# Patient Record
Sex: Female | Born: 1965 | Race: White | Hispanic: No | Marital: Married | Smoking: Never smoker
Health system: Southern US, Community
[De-identification: ages and names within clinical notes are randomized; demographics above are authoritative.]

## PROBLEM LIST (undated history)

## (undated) DIAGNOSIS — G43909 Migraine, unspecified, not intractable, without status migrainosus: Secondary | ICD-10-CM

## (undated) DIAGNOSIS — E119 Type 2 diabetes mellitus without complications: Secondary | ICD-10-CM

## (undated) DIAGNOSIS — I10 Essential (primary) hypertension: Secondary | ICD-10-CM

## (undated) DIAGNOSIS — R112 Nausea with vomiting, unspecified: Secondary | ICD-10-CM

## (undated) DIAGNOSIS — Z9885 Transplanted organ removal status: Secondary | ICD-10-CM

## (undated) DIAGNOSIS — G4700 Insomnia, unspecified: Secondary | ICD-10-CM

## (undated) DIAGNOSIS — Z9889 Other specified postprocedural states: Secondary | ICD-10-CM

## (undated) DIAGNOSIS — E785 Hyperlipidemia, unspecified: Secondary | ICD-10-CM

## (undated) HISTORY — DX: Migraine, unspecified, not intractable, without status migrainosus: G43.909

## (undated) HISTORY — DX: Type 2 diabetes mellitus without complications: E11.9

## (undated) HISTORY — DX: Insomnia, unspecified: G47.00

## (undated) HISTORY — PX: WISDOM TOOTH EXTRACTION: SHX21

## (undated) HISTORY — DX: Hyperlipidemia, unspecified: E78.5

---

## 1989-12-07 DIAGNOSIS — Z9885 Transplanted organ removal status: Secondary | ICD-10-CM

## 1989-12-07 HISTORY — PX: SINUS SURGERY WITH INSTATRAK: SHX5215

## 1989-12-07 HISTORY — DX: Transplanted organ removal status: Z98.85

## 1989-12-07 HISTORY — PX: NEPHRECTOMY: SHX65

## 1999-02-06 ENCOUNTER — Other Ambulatory Visit: Admission: RE | Admit: 1999-02-06 | Discharge: 1999-02-06 | Payer: Self-pay | Admitting: *Deleted

## 2000-03-30 ENCOUNTER — Other Ambulatory Visit: Admission: RE | Admit: 2000-03-30 | Discharge: 2000-03-30 | Payer: Self-pay | Admitting: *Deleted

## 2001-04-04 ENCOUNTER — Other Ambulatory Visit: Admission: RE | Admit: 2001-04-04 | Discharge: 2001-04-04 | Payer: Self-pay | Admitting: *Deleted

## 2002-04-24 ENCOUNTER — Other Ambulatory Visit: Admission: RE | Admit: 2002-04-24 | Discharge: 2002-04-24 | Payer: Self-pay | Admitting: *Deleted

## 2002-05-26 ENCOUNTER — Encounter: Admission: RE | Admit: 2002-05-26 | Discharge: 2002-05-26 | Payer: Self-pay | Admitting: Internal Medicine

## 2002-05-26 ENCOUNTER — Encounter: Payer: Self-pay | Admitting: Internal Medicine

## 2003-05-30 ENCOUNTER — Other Ambulatory Visit: Admission: RE | Admit: 2003-05-30 | Discharge: 2003-05-30 | Payer: Self-pay | Admitting: *Deleted

## 2004-08-28 ENCOUNTER — Other Ambulatory Visit: Admission: RE | Admit: 2004-08-28 | Discharge: 2004-08-28 | Payer: Self-pay | Admitting: Obstetrics and Gynecology

## 2005-10-22 ENCOUNTER — Encounter: Admission: RE | Admit: 2005-10-22 | Discharge: 2005-10-22 | Payer: Self-pay | Admitting: Obstetrics and Gynecology

## 2006-03-05 ENCOUNTER — Other Ambulatory Visit: Admission: RE | Admit: 2006-03-05 | Discharge: 2006-03-05 | Payer: Self-pay | Admitting: Obstetrics and Gynecology

## 2006-12-01 ENCOUNTER — Ambulatory Visit: Payer: Self-pay | Admitting: Internal Medicine

## 2006-12-01 LAB — CONVERTED CEMR LAB
ALT: 34 units/L (ref 0–40)
AST: 29 units/L (ref 0–37)
Albumin: 3.9 g/dL (ref 3.5–5.2)
Alkaline Phosphatase: 39 units/L (ref 39–117)
BUN: 12 mg/dL (ref 6–23)
Basophils Absolute: 0 10*3/uL (ref 0.0–0.1)
Basophils Relative: 0.6 % (ref 0.0–1.0)
Bilirubin Urine: NEGATIVE
CO2: 30 meq/L (ref 19–32)
Calcium: 9.9 mg/dL (ref 8.4–10.5)
Chloride: 108 meq/L (ref 96–112)
Chol/HDL Ratio, serum: 4.3
Cholesterol: 251 mg/dL (ref 0–200)
Creatinine, Ser: 0.8 mg/dL (ref 0.4–1.2)
Eosinophil percent: 2.2 % (ref 0.0–5.0)
GFR calc non Af Amer: 85 mL/min
Glomerular Filtration Rate, Af Am: 103 mL/min/{1.73_m2}
Glucose, Bld: 95 mg/dL (ref 70–99)
HCT: 37.2 % (ref 36.0–46.0)
HDL: 57.9 mg/dL (ref >39.0)
Hemoglobin, Urine: NEGATIVE
Hemoglobin: 13.1 g/dL (ref 12.0–15.0)
Ketones, ur: NEGATIVE mg/dL
LDL DIRECT: 197.2 mg/dL
Leukocytes, UA: NEGATIVE
Lymphocytes Relative: 36.1 % (ref 12.0–46.0)
MCHC: 35.2 g/dL (ref 30.0–36.0)
MCV: 92.8 fL (ref 78.0–100.0)
Monocytes Absolute: 0.5 10*3/uL (ref 0.2–0.7)
Monocytes Relative: 8.8 % (ref 3.0–11.0)
Neutro Abs: 3.2 10*3/uL (ref 1.4–7.7)
Neutrophils Relative %: 52.3 % (ref 43.0–77.0)
Nitrite: NEGATIVE
Platelets: 337 10*3/uL (ref 150–400)
Potassium: 4.4 meq/L (ref 3.5–5.1)
RBC: 4.01 M/uL (ref 3.87–5.11)
RDW: 11.7 % (ref 11.5–14.6)
Sodium: 143 meq/L (ref 135–145)
Specific Gravity, Urine: 1.025 (ref 1.000–1.03)
TSH: 0.95 microintl units/mL (ref 0.35–5.50)
Total Bilirubin: 0.6 mg/dL (ref 0.3–1.2)
Total Protein, Urine: NEGATIVE mg/dL
Total Protein: 7.1 g/dL (ref 6.0–8.3)
Triglyceride fasting, serum: 73 mg/dL (ref 0–149)
Urine Glucose: NEGATIVE mg/dL
Urobilinogen, UA: 0.2 (ref 0.0–1.0)
VLDL: 15 mg/dL (ref 0–40)
WBC: 5.9 10*3/uL (ref 4.5–10.5)
pH: 6 (ref 5.0–8.0)

## 2006-12-08 ENCOUNTER — Ambulatory Visit: Payer: Self-pay | Admitting: Internal Medicine

## 2007-01-07 ENCOUNTER — Ambulatory Visit: Payer: Self-pay | Admitting: Internal Medicine

## 2007-12-15 ENCOUNTER — Ambulatory Visit: Payer: Self-pay | Admitting: Internal Medicine

## 2007-12-19 LAB — CONVERTED CEMR LAB
ALT: 22 units/L (ref 0–35)
AST: 21 units/L (ref 0–37)
Albumin: 4.2 g/dL (ref 3.5–5.2)
Alkaline Phosphatase: 53 units/L (ref 39–117)
BUN: 9 mg/dL (ref 6–23)
Basophils Absolute: 0 10*3/uL (ref 0.0–0.1)
Basophils Relative: 0.6 % (ref 0.0–1.0)
Bilirubin Urine: NEGATIVE
Bilirubin, Direct: 0.1 mg/dL (ref 0.0–0.3)
CO2: 26 meq/L (ref 19–32)
Calcium: 9.6 mg/dL (ref 8.4–10.5)
Chloride: 100 meq/L (ref 96–112)
Cholesterol: 249 mg/dL (ref 0–200)
Creatinine, Ser: 0.9 mg/dL (ref 0.4–1.2)
Direct LDL: 178.4 mg/dL
Eosinophils Absolute: 0.1 10*3/uL (ref 0.0–0.6)
Eosinophils Relative: 1.5 % (ref 0.0–5.0)
GFR calc Af Amer: 90 mL/min
GFR calc non Af Amer: 74 mL/min
Glucose, Bld: 102 mg/dL — ABNORMAL HIGH (ref 70–99)
HCT: 36.1 % (ref 36.0–46.0)
HDL: 48.8 mg/dL (ref >39.0)
Hemoglobin, Urine: NEGATIVE
Hemoglobin: 12.8 g/dL (ref 12.0–15.0)
Leukocytes, UA: NEGATIVE
Lymphocytes Relative: 30.8 % (ref 12.0–46.0)
MCHC: 35.5 g/dL (ref 30.0–36.0)
MCV: 92 fL (ref 78.0–100.0)
Monocytes Absolute: 0.5 10*3/uL (ref 0.2–0.7)
Monocytes Relative: 8.2 % (ref 3.0–11.0)
Neutro Abs: 3.6 10*3/uL (ref 1.4–7.7)
Neutrophils Relative %: 58.9 % (ref 43.0–77.0)
Nitrite: NEGATIVE
Platelets: 315 10*3/uL (ref 150–400)
Potassium: 3.7 meq/L (ref 3.5–5.1)
RBC: 3.92 M/uL (ref 3.87–5.11)
RDW: 11.9 % (ref 11.5–14.6)
Sodium: 135 meq/L (ref 135–145)
Specific Gravity, Urine: >=1.03 (ref 1.000–1.03)
TSH: 1.44 microintl units/mL (ref 0.35–5.50)
Total Bilirubin: 1 mg/dL (ref 0.3–1.2)
Total CHOL/HDL Ratio: 5.1
Total Protein, Urine: NEGATIVE mg/dL
Total Protein: 7.3 g/dL (ref 6.0–8.3)
Triglycerides: 85 mg/dL (ref 0–149)
Urine Glucose: NEGATIVE mg/dL
Urobilinogen, UA: 0.2 (ref 0.0–1.0)
VLDL: 17 mg/dL (ref 0–40)
WBC: 6 10*3/uL (ref 4.5–10.5)
pH: 6 (ref 5.0–8.0)

## 2007-12-28 ENCOUNTER — Ambulatory Visit: Payer: Self-pay | Admitting: Internal Medicine

## 2007-12-28 DIAGNOSIS — E785 Hyperlipidemia, unspecified: Secondary | ICD-10-CM | POA: Insufficient documentation

## 2007-12-28 DIAGNOSIS — K219 Gastro-esophageal reflux disease without esophagitis: Secondary | ICD-10-CM | POA: Insufficient documentation

## 2007-12-28 DIAGNOSIS — I10 Essential (primary) hypertension: Secondary | ICD-10-CM | POA: Insufficient documentation

## 2007-12-28 DIAGNOSIS — J019 Acute sinusitis, unspecified: Secondary | ICD-10-CM

## 2007-12-28 DIAGNOSIS — R519 Headache, unspecified: Secondary | ICD-10-CM | POA: Insufficient documentation

## 2007-12-28 DIAGNOSIS — R51 Headache: Secondary | ICD-10-CM

## 2008-02-01 ENCOUNTER — Encounter: Payer: Self-pay | Admitting: Internal Medicine

## 2008-02-02 ENCOUNTER — Encounter: Payer: Self-pay | Admitting: Internal Medicine

## 2008-02-09 ENCOUNTER — Telehealth: Payer: Self-pay | Admitting: Internal Medicine

## 2008-05-11 ENCOUNTER — Telehealth: Payer: Self-pay | Admitting: Internal Medicine

## 2008-05-22 ENCOUNTER — Telehealth: Payer: Self-pay | Admitting: Internal Medicine

## 2008-06-05 ENCOUNTER — Encounter: Admission: RE | Admit: 2008-06-05 | Discharge: 2008-06-05 | Payer: Self-pay | Admitting: Obstetrics and Gynecology

## 2008-06-25 ENCOUNTER — Ambulatory Visit: Payer: Self-pay | Admitting: Internal Medicine

## 2008-06-25 DIAGNOSIS — G4700 Insomnia, unspecified: Secondary | ICD-10-CM | POA: Insufficient documentation

## 2008-08-23 ENCOUNTER — Telehealth: Payer: Self-pay | Admitting: Internal Medicine

## 2008-12-21 ENCOUNTER — Ambulatory Visit: Payer: Self-pay | Admitting: Internal Medicine

## 2008-12-21 LAB — CONVERTED CEMR LAB
ALT: 20 units/L (ref 0–35)
AST: 23 units/L (ref 0–37)
Albumin: 4.2 g/dL (ref 3.5–5.2)
Alkaline Phosphatase: 49 units/L (ref 39–117)
BUN: 15 mg/dL (ref 6–23)
Basophils Absolute: 0.1 10*3/uL (ref 0.0–0.1)
Basophils Relative: 1.2 % (ref 0.0–3.0)
Bilirubin Urine: NEGATIVE
Bilirubin, Direct: 0.1 mg/dL (ref 0.0–0.3)
CO2: 28 meq/L (ref 19–32)
Calcium: 9.4 mg/dL (ref 8.4–10.5)
Chloride: 106 meq/L (ref 96–112)
Cholesterol: 227 mg/dL (ref 0–200)
Creatinine, Ser: 1 mg/dL (ref 0.4–1.2)
Direct LDL: 163.4 mg/dL
Eosinophils Absolute: 0.2 10*3/uL (ref 0.0–0.7)
Eosinophils Relative: 2.5 % (ref 0.0–5.0)
GFR calc Af Amer: 79 mL/min
GFR calc non Af Amer: 65 mL/min
Glucose, Bld: 109 mg/dL — ABNORMAL HIGH (ref 70–99)
HCT: 35.7 % — ABNORMAL LOW (ref 36.0–46.0)
HDL: 49.2 mg/dL (ref >39.0)
Hemoglobin, Urine: NEGATIVE
Hemoglobin: 12.7 g/dL (ref 12.0–15.0)
Ketones, ur: NEGATIVE mg/dL
Leukocytes, UA: NEGATIVE
Lymphocytes Relative: 27.7 % (ref 12.0–46.0)
MCHC: 35.5 g/dL (ref 30.0–36.0)
MCV: 94 fL (ref 78.0–100.0)
Monocytes Absolute: 0.4 10*3/uL (ref 0.1–1.0)
Monocytes Relative: 7.2 % (ref 3.0–12.0)
Neutro Abs: 3.8 10*3/uL (ref 1.4–7.7)
Neutrophils Relative %: 61.4 % (ref 43.0–77.0)
Nitrite: NEGATIVE
Platelets: 332 10*3/uL (ref 150–400)
Potassium: 4 meq/L (ref 3.5–5.1)
RBC: 3.8 M/uL — ABNORMAL LOW (ref 3.87–5.11)
RDW: 11.4 % — ABNORMAL LOW (ref 11.5–14.6)
Sodium: 140 meq/L (ref 135–145)
Specific Gravity, Urine: >=1.03 (ref 1.000–1.03)
TSH: 1.02 microintl units/mL (ref 0.35–5.50)
Total Bilirubin: 0.8 mg/dL (ref 0.3–1.2)
Total CHOL/HDL Ratio: 4.6
Total Protein, Urine: NEGATIVE mg/dL
Total Protein: 7.1 g/dL (ref 6.0–8.3)
Triglycerides: 68 mg/dL (ref 0–149)
Urine Glucose: NEGATIVE mg/dL
Urobilinogen, UA: 0.2 (ref 0.0–1.0)
VLDL: 14 mg/dL (ref 0–40)
WBC: 6.2 10*3/uL (ref 4.5–10.5)
pH: 5 (ref 5.0–8.0)

## 2008-12-28 ENCOUNTER — Ambulatory Visit: Payer: Self-pay | Admitting: Internal Medicine

## 2008-12-28 DIAGNOSIS — R0989 Other specified symptoms and signs involving the circulatory and respiratory systems: Secondary | ICD-10-CM | POA: Insufficient documentation

## 2008-12-28 DIAGNOSIS — R7309 Other abnormal glucose: Secondary | ICD-10-CM | POA: Insufficient documentation

## 2009-01-03 ENCOUNTER — Ambulatory Visit: Payer: Self-pay

## 2009-01-03 ENCOUNTER — Encounter: Payer: Self-pay | Admitting: Internal Medicine

## 2009-02-12 ENCOUNTER — Telehealth: Payer: Self-pay | Admitting: Internal Medicine

## 2009-07-08 ENCOUNTER — Encounter: Payer: Self-pay | Admitting: Internal Medicine

## 2009-08-20 ENCOUNTER — Telehealth: Payer: Self-pay | Admitting: Internal Medicine

## 2009-09-03 ENCOUNTER — Ambulatory Visit: Payer: Self-pay | Admitting: Internal Medicine

## 2009-09-23 ENCOUNTER — Telehealth: Payer: Self-pay | Admitting: Internal Medicine

## 2010-03-27 ENCOUNTER — Encounter: Payer: Self-pay | Admitting: Internal Medicine

## 2010-03-27 ENCOUNTER — Telehealth: Payer: Self-pay | Admitting: Internal Medicine

## 2010-10-03 ENCOUNTER — Ambulatory Visit: Payer: Self-pay | Admitting: Internal Medicine

## 2010-10-03 ENCOUNTER — Telehealth: Payer: Self-pay | Admitting: Internal Medicine

## 2010-10-03 LAB — CONVERTED CEMR LAB
ALT: 20 units/L (ref 0–35)
AST: 19 units/L (ref 0–37)
Albumin: 4 g/dL (ref 3.5–5.2)
Alkaline Phosphatase: 54 units/L (ref 39–117)
Basophils Absolute: 0 10*3/uL (ref 0.0–0.1)
Calcium: 9.6 mg/dL (ref 8.4–10.5)
Eosinophils Relative: 1.6 % (ref 0.0–5.0)
GFR calc non Af Amer: 81.11 mL/min (ref >60)
HCT: 35.3 % — ABNORMAL LOW (ref 36.0–46.0)
HDL: 43.4 mg/dL (ref >39.00)
Hemoglobin: 12.4 g/dL (ref 12.0–15.0)
Lymphocytes Relative: 28.6 % (ref 12.0–46.0)
Lymphs Abs: 1.6 10*3/uL (ref 0.7–4.0)
Monocytes Relative: 7.8 % (ref 3.0–12.0)
Neutro Abs: 3.5 10*3/uL (ref 1.4–7.7)
Nitrite: NEGATIVE
Potassium: 4.4 meq/L (ref 3.5–5.1)
RBC: 3.72 M/uL — ABNORMAL LOW (ref 3.87–5.11)
RDW: 13 % (ref 11.5–14.6)
Sodium: 138 meq/L (ref 135–145)
Specific Gravity, Urine: >=1.03 (ref 1.000–1.030)
TSH: 1.37 microintl units/mL (ref 0.35–5.50)
Total Protein, Urine: NEGATIVE mg/dL
Urobilinogen, UA: 0.2 (ref 0.0–1.0)
VLDL: 17.6 mg/dL (ref 0.0–40.0)
WBC: 5.7 10*3/uL (ref 4.5–10.5)

## 2010-10-07 ENCOUNTER — Ambulatory Visit: Payer: Self-pay | Admitting: Internal Medicine

## 2010-10-07 ENCOUNTER — Encounter: Payer: Self-pay | Admitting: Internal Medicine

## 2010-10-07 DIAGNOSIS — R209 Unspecified disturbances of skin sensation: Secondary | ICD-10-CM

## 2010-10-07 LAB — CONVERTED CEMR LAB: Vitamin B-12: 317 pg/mL (ref 211–911)

## 2010-10-14 ENCOUNTER — Telehealth: Payer: Self-pay | Admitting: Internal Medicine

## 2010-10-20 ENCOUNTER — Telehealth (INDEPENDENT_AMBULATORY_CARE_PROVIDER_SITE_OTHER): Payer: Self-pay | Admitting: *Deleted

## 2010-10-20 ENCOUNTER — Encounter: Payer: Self-pay | Admitting: Internal Medicine

## 2010-10-24 ENCOUNTER — Telehealth: Payer: Self-pay | Admitting: Internal Medicine

## 2011-01-08 NOTE — Assessment & Plan Note (Signed)
Summary: CPX /NWS  #   Vital Signs:  Patient profile:   45 year old female Height:      64 inches Weight:      159 pounds BMI:     27.39 Temp:     98.6 degrees F oral Pulse rate:   72 / minute Pulse rhythm:   regular Resp:     16 per minute BP sitting:   120 / 80  (left arm) Cuff size:   regular  Vitals Entered By: Lanier Prude, CMA(AAMA) (October 07, 2010 8:37 AM) CC: CPX Is Patient Diabetic? No   CC:  CPX.  History of Present Illness: The patient presents for a preventive health examination.  Preventive Screening-Counseling & Management  Caffeine-Diet-Exercise     Does Patient Exercise: yes  Current Medications (verified): 1)  Zolpidem Tartrate 10 Mg  Tabs (Zolpidem Tartrate) .Marland Kitchen.. 1 By Mouth At [facility] Prn 2)  Imitrex 50 Mg  Tabs (Sumatriptan Succinate) .Marland Kitchen.. 1 By Mouth Once Daily As Needed 3)  Macrobid 100 Mg Caps (Nitrofurantoin Monohyd Macro) .... Once Daily 4)  Vitamin D3 1000 Unit  Tabs (Cholecalciferol) .Marland Kitchen.. 1 Qd 5)  Diovan 320 Mg Tabs (Valsartan) .Marland Kitchen.. 1 By Mouth Daily 6)  Macrobid 100 Mg Caps (Nitrofurantoin Monohyd Macro) .Marland Kitchen.. 1 By Mouth Two Times A Day For A Urinary Infection  Allergies (verified): 1)  ! Pcn 2)  ! Fentanyl Citrate (Fentanyl Citrate) 3)  Codeine 4)  Sulfa  Past History:  Past Medical History: Last updated: 12/28/2008 Headache, migraines Hyperlipidemia Hypertension GERD      Dr Marina Goodell UTIs  gyn     Dr Adalberto Ill  Family History: Last updated: 12/28/2007 Family History High cholesterol  Past Surgical History: Denies surgical history  Social History: [employer], television/film set Married Never Smoked Regular exercise-yes Does Patient Exercise:  yes  Review of Systems  The patient denies anorexia, fever, weight loss, weight gain, vision loss, decreased hearing, hoarseness, chest pain, syncope, dyspnea on exertion, peripheral edema, prolonged cough, headaches, hemoptysis, abdominal pain, melena, hematochezia, severe indigestion/heartburn,  hematuria, incontinence, genital sores, muscle weakness, suspicious skin lesions, transient blindness, difficulty walking, depression, unusual weight change, abnormal bleeding, enlarged lymph nodes, angioedema, and breast masses.         forgetfull HAs  Physical Exam  General:  Well-developed,well-nourished,in no acute distress; alert,appropriate and cooperative throughout examination Head:  Normocephalic and atraumatic without obvious abnormalities. No apparent alopecia or balding. Eyes:  No corneal or conjunctival inflammation noted. EOMI. Perrla.  Ears:  External ear exam shows no significant lesions or deformities.  Otoscopic examination reveals clear canals, tympanic membranes are intact bilaterally without bulging, retraction, inflammation or discharge. Hearing is grossly normal bilaterally. Nose:  External nasal examination shows no deformity or inflammation. Nasal mucosa are pink and moist without lesions or exudates. Mouth:  Oral mucosa and oropharynx without lesions or exudates.  Teeth in good repair. Lungs:  Normal respiratory effort, chest expands symmetrically. Lungs are clear to auscultation, no crackles or wheezes. Heart:  Normal rate and regular rhythm. S1 and S2 normal without gallop, murmur, click, rub or other extra sounds. Abdomen:  Bowel sounds positive,abdomen soft and non-tender without masses, organomegaly or hernias noted. Msk:  No deformity or scoliosis noted of thoracic or lumbar spine.   Extremities:  No clubbing, cyanosis, edema, or deformity noted with normal full range of motion of all joints.   Neurologic:  No cranial nerve deficits noted. Station and gait are normal. Plantar reflexes are down-going bilaterally. DTRs are  symmetrical throughout. Sensory, motor and coordinative functions appear intact. Skin:  Intact without suspicious lesions or rashes Psych:  Cognition and judgment appear intact. Alert and cooperative with normal attention span and concentration. No  apparent delusions, illusions, hallucinations   Impression & Recommendations:  Problem # 1:  ROUTINE GENERAL MEDICAL EXAM@HEALTH  CARE FACL (ICD-V70.0) Assessment New Health and age related issues were discussed. Available screening tests and vaccinations were discussed as well. Healthy life style including good diet and exercise was discussed.  The labs were reviewed with the patient.  Orders: EKG w/ Interpretation (93000)  Problem # 2:  ABNORMAL GLUCOSE NEC (ICD-790.29) Assessment: Comment Only  Labs Reviewed: Creat: 0.8 (10/03/2010)     Problem # 3:  HYPERLIPIDEMIA (ICD-272.4) Assessment: Comment Only On diet  Problem # 4:  HEADACHE (ICD-784.0) poss work related - heavy helmet, confined space, noise etc Assessment: Deteriorated  Her updated medication list for this problem includes:    Imitrex 50 Mg Tabs (Sumatriptan succinate) .Marland Kitchen... 1 by mouth once daily as needed  Problem # 5:  Forgetfullness - mild Assessment: New She has been working a lot - up to 60 hrs per wk - asked to rest more  Complete Medication List: 1)  Imitrex 50 Mg Tabs (Sumatriptan succinate) .Marland Kitchen.. 1 by mouth once daily as needed 2)  Macrobid 100 Mg Caps (Nitrofurantoin monohyd macro) .... Once daily 3)  Vitamin D3 1000 Unit Tabs (Cholecalciferol) .Marland Kitchen.. 1 qd 4)  Diovan 320 Mg Tabs (Valsartan) .Marland Kitchen.. 1 by mouth daily 5)  Sonata 10 Mg Caps (Zaleplon) .Marland Kitchen.. 1 by mouth at bedtime prn  Other Orders: TLB-B12, Serum-Total ONLY (81191-Y78)  Patient Instructions: 1)  Please schedule a follow-up appointment in 1 year well w/labs. Prescriptions: SONATA 10 MG CAPS (ZALEPLON) 1 by mouth at bedtime prn  #30 x 6   Entered and Authorized by:   Tresa Garter MD   Signed by:   Tresa Garter MD on 10/07/2010   Method used:   Print then Give to Patient   RxID:   [id number] MACROBID 100 MG CAPS (NITROFURANTOIN MONOHYD MACRO) once daily  #90 x 3   Entered and Authorized by:   Tresa Garter MD    Signed by:   Tresa Garter MD on 10/07/2010   Method used:   Print then Give to Patient   RxID:   [id number] DIOVAN 320 MG TABS (VALSARTAN) 1 by mouth daily  #90 Tablet x 3   Entered and Authorized by:   Tresa Garter MD   Signed by:   Tresa Garter MD on 10/07/2010   Method used:   Print then Give to Patient   RxID:   [id number]    Orders Added: 1)  EKG w/ Interpretation [93000] 2)  TLB-B12, Serum-Total ONLY [82607-B12] 3)  Est. Patient 40-64 years [56387]   Immunization History:  Tetanus/Td Immunization History:    Tetanus/Td:  historical (07/18/2009)  Influenza Immunization History:    Influenza:  historical (09/25/2010)   Immunization History:  Tetanus/Td Immunization History:    Tetanus/Td:  Historical (07/18/2009)  Influenza Immunization History:    Influenza:  Historical (09/25/2010)  Preventive Care Screening  Mammogram:    Date:  01/07/2010    Results:  normal   Pap Smear:    Date:  01/07/2010    Results:  normal

## 2011-01-08 NOTE — Progress Notes (Signed)
Summary: AMBIEN  Phone Note Call from Patient Call back at [phone]   Summary of Call: Patient is requesting rx for ambien to be changed to be 1 once daily. Her insurance will only pay for #15 mthly w/the current directions.  Initial call taken by: Lamar Sprinkles, CMA,  March 27, 2010 10:55 AM  Follow-up for Phone Call        ok Follow-up by: Tresa Garter MD,  March 27, 2010 1:18 PM  Additional Follow-up for Phone Call Additional follow up Details #1::        Pt informed  Additional Follow-up by: Lamar Sprinkles, CMA,  March 27, 2010 1:48 PM    New/Updated Medications: ZOLPIDEM TARTRATE 10 MG  TABS (ZOLPIDEM TARTRATE) 1 by mouth at hs prn Prescriptions: ZOLPIDEM TARTRATE 10 MG  TABS (ZOLPIDEM TARTRATE) 1 by mouth at hs prn  #30 x 6   Entered by:   Lamar Sprinkles, CMA   Authorized by:   Tresa Garter MD   Signed by:   Lamar Sprinkles, CMA on 03/27/2010   Method used:   Telephoned to ...       [facility]  [address] [phone]* (retail)       [address]       [place], [place]  [address]       Ph: [phone] or [phone]       Fax: [phone]   RxID:   [id number]

## 2011-01-08 NOTE — Progress Notes (Signed)
Summary: PA-Lunesta  Phone Note From Pharmacy   Summary of Call: PA-Lunesta called [employer] @ [phone], awaiting form. Dagoberto Reef  October 20, 2010 8:27 AM  Faxed to [facility] @ [phone], awaiting approval. Dagoberto Reef  October 20, 2010 8:46 AM  Alfonso Patten approved 09/29/10-10/20/2011, pt aware. Initial call taken by: Dagoberto Reef,  October 20, 2010 10:15 AM

## 2011-01-08 NOTE — Progress Notes (Signed)
Summary: UTI  Phone Note Call from Patient   Summary of Call: Pt has UTI per MD, needs cipro. Pt informed but is req rx for macrobid. She feels that cipro never works for her and has had success with macrobid.  Initial call taken by: Lamar Sprinkles, CMA,  October 03, 2010 3:38 PM  Follow-up for Phone Call        ok Follow-up by: Tresa Garter MD,  October 03, 2010 4:51 PM    New/Updated Medications: MACROBID 100 MG CAPS (NITROFURANTOIN MONOHYD MACRO) 1 by mouth two times a day for a urinary infection Prescriptions: MACROBID 100 MG CAPS (NITROFURANTOIN MONOHYD MACRO) 1 by mouth two times a day for a urinary infection  #10 x 1   Entered and Authorized by:   Tresa Garter MD   Signed by:   Lamar Sprinkles, CMA on 10/03/2010   Method used:   Electronically to        [facility]  [address] [phone]* (retail)       [address]       [place], [place]  [address]       Ph: [phone] or [phone]       Fax: [phone]   RxID:   [id number]

## 2011-01-08 NOTE — Medication Information (Signed)
Summary: Prior autho & approved for Lunesta/[facility]  Prior autho & approved for Lunesta/[facility]   Imported By: Sherian Rein 10/22/2010 10:33:57  _____________________________________________________________________  External Attachment:    Type:   Image     Comment:   External Document

## 2011-01-08 NOTE — Progress Notes (Signed)
Summary: LUNESTA/Plot pt  Phone Note Call from Patient Call back at [phone]   Summary of Call: Pt wanted to stop ambien and was changed to sonata. She can not sleep on sonata. Pt would like to try lunesta that MD suggested at office visit.  Initial call taken by: Lamar Sprinkles, CMA,  October 14, 2010 9:18 AM  Follow-up for Phone Call        ok - sonota changed to lunesta and listed historically on EMR - may call in rx for pt, given #20 no refills to see if the med will work - f/u AVP if not effective Follow-up by: Newt Lukes MD,  October 14, 2010 9:59 AM  Additional Follow-up for Phone Call Additional follow up Details #1::        Pt informed  Additional Follow-up by: Lamar Sprinkles, CMA,  October 14, 2010 3:25 PM    New/Updated Medications: LUNESTA 3 MG TABS (ESZOPICLONE) 1 by mouth at bedtime as needed for sleep Prescriptions: LUNESTA 3 MG TABS (ESZOPICLONE) 1 by mouth at bedtime as needed for sleep  #20 x 0   Entered by:   Lamar Sprinkles, CMA   Authorized by:   Tresa Garter MD   Signed by:   Lamar Sprinkles, CMA on 10/14/2010   Method used:   Telephoned to ...       [facility]  [address] [phone]* (retail)       [address]       [place], [place]  [address]       Ph: [phone] or [phone]       Fax: [phone]   RxID:   [id number] LUNESTA 3 MG TABS (ESZOPICLONE) 1 by mouth at bedtime as needed for sleep  #20 x 0   Entered by:   Newt Lukes MD   Authorized by:   Tresa Garter MD   Signed by:   Newt Lukes MD on 10/14/2010   Method used:   Historical   RxID:   [id number]

## 2011-01-08 NOTE — Progress Notes (Signed)
Summary: Zolpidem PA  Phone Note From Pharmacy   Summary of Call: PA request--Zolpidem.  Available online, completed and approved until 2012. Initial call taken by: Lucious Groves,  March 27, 2010 4:59 PM

## 2011-01-08 NOTE — Progress Notes (Signed)
SummaryAlfonso Diaz  Phone Note Refill Request Call back at [facility] Phone [phone]   Refills Requested: Medication #1:  LUNESTA 3 MG TABS 1 by mouth at bedtime as needed for sleep. Patient is requesting rx for #30, PA was approved -[facility]  Initial call taken by: Lamar Sprinkles, CMA,  October 24, 2010 2:55 PM  Follow-up for Phone Call        ok 6 ref Follow-up by: Tresa Garter MD,  October 24, 2010 5:42 PM  Additional Follow-up for Phone Call Additional follow up Details #1::        Pt informed  Additional Follow-up by: Lamar Sprinkles, CMA,  October 24, 2010 6:11 PM    Prescriptions: LUNESTA 3 MG TABS (ESZOPICLONE) 1 by mouth at bedtime as needed for sleep  #90 x 1   Entered by:   Lamar Sprinkles, CMA   Authorized by:   Tresa Garter MD   Signed by:   Lamar Sprinkles, CMA on 10/24/2010   Method used:   Telephoned to ...       [facility]  [address] [phone]* (retail)       [address]       [place], [place]  [address]       Ph: [phone] or [phone]       Fax: [phone]   RxID:   [id number]

## 2011-01-09 NOTE — Medication Information (Signed)
Summary: Approved/[facility]  Approved/[employer]   Imported By: Lester [place] 04/04/2010 10:36:52  _____________________________________________________________________  External Attachment:    Type:   Image     Comment:   External Document

## 2011-04-24 NOTE — Assessment & Plan Note (Signed)
[facility]                         GASTROENTEROLOGY OFFICE NOTE   [name], [name]                     MRN:          [id number]  DATE:01/07/2007                            DOB:          1966-04-20    REFERRING PHYSICIAN:  Georgina Quint. Plotnikov, MD.   REASON FOR CONSULTATION:  Acid reflux, gas and a family history of colon  cancer.   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 Posey Rea on December 08, 2006.  She is  referred now regarding the above listed issues.  First, patient has had  some problems with heartburn intermittently over the past year.  For  this she takes Tums with good relief of symptoms.  No nausea, vomiting,  or dysphagia.  She has had some weight gain over the past year.  Next,  she reports some mild gas and bloating generally after meals.  No  associated pain.  Her bowel habits have been regular.  No melena or  hematochezia.  Her appetite is good.  She does have a family history of  colon cancer in her maternal grandmother.  Grandmother was diagnosed at  a very advanced age while she was residing in a nursing home.  The  patient's mother has been screened with no polyps found.  There were no  other family members with a history of colon cancer or colon polyps.  Patient, herself, denies having undergone prior GI evaluations.   PAST MEDICAL HISTORY:  1. Hypertension.  2. Hyperlipidemia.  3. History of migraines.   PAST SURGICAL HISTORY:  History of right nephrectomy.   ALLERGIES:  FENTANYL which results in a rash and shortness of breath.   CURRENT MEDICATION:  Methyldopa 250 mg b.i.d.   FAMILY HISTORY:  Maternal grandmother diagnosed with colon cancer at an  advanced age.  Mother and grandparents also with diabetes and heart  disease.   SOCIAL HISTORY:  Patient is married without children.  She has a  bachelor's degree.  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  She does not smoke or use alcohol.   REVIEW OF SYSTEMS:  Per diagnostic evaluation form.   PHYSICAL EXAMINATION:  GENERAL APPEARANCE:  A well-appearing female in  no acute distress.  VITAL SIGNS:  Blood pressure 124/84, heart rate 78, weight 146.2 pounds,  she is 5 feet 2 inches in height.  HEENT:  Sclerae are anicteric.  Conjunctivae are pink.  Oral mucosa is  intact, no adenopathy.  LUNGS:  Clear.  CARDIOVASCULAR:  Regular.  ABDOMEN:  Soft without tenderness, mass or hernia.  Good bowel sounds  heard.   LABORATORY DATA:  Recent laboratories reveal normal CBC and  comprehensive metabolic panel as well as thyroid testing and urinalysis.   IMPRESSION:  1. Gastroesophageal reflux disease, mild without alarm symptoms.  2. Mild gas and bloating, nonspecific.  3. Family history of colon cancer in an elderly second degree      relative.   RECOMMENDATIONS:  1. Continue to use antacids p.r.n.  If symptoms become more  prominent,      could consider once daily proton pump inhibitor therapy with      Prilosec OTC.  2. Reflux precautions with attention to weight loss.  3. Due for a routine screening colonoscopy at age 69 unless otherwise      clinically indicated.     Wilhemina Bonito. Marina Goodell, MD  Electronically Signed    JNP/MedQ  DD: 01/07/2007  DT: 01/07/2007  Job #: [id number]

## 2011-04-24 NOTE — Assessment & Plan Note (Signed)
[facility]                           PRIMARY CARE OFFICE NOTE   NAME:[name], [name]                     MRN:          [id number]  DATE:12/08/2006                            DOB:          May 11, 1966    The patient is a 45 year old female who presents to reestablish primary  and for wellness examination.   PAST MEDICAL HISTORY:  1. Elevated blood pressure.  2. Elevated cholesterol.   FAMILY HISTORY:  Her father has elevated cholesterol.   SOCIAL HISTORY:  She is currently employed in [employer],  working 40-50 hours per week.  The job is stressful.  The husband is  planning to become a Garment/textile technologist in [place].  She is planning a  pregnancy.   CURRENT MEDICATIONS:  1. Folic acid daily.  2. Methyldopa 250 mg 3 times a day.  3. She just went off the birth control pill.   ALLERGIES:  1. PENICILLIN.  2. CODEINE.  3. SULFA.  4. FENTANYL.   REVIEW OF SYSTEMS:  No chest pain or shortness of breath.  No syncope.  She was using Ambien for chronic insomnia.  Tylenol for occasional  headaches.  The rest of the 18-point review of systems is negative.   PHYSICAL EXAMINATION:  VITAL SIGNS:  Blood pressure 131/86, pulse 73,  temperature 97.2, weight 149 pounds.  GENERAL:  She is in no acute distress.  Looks well.  HEENT:  With moist mucosa.  NECK:  Supple. No thyromegaly or bruit.  LUNGS:  Clear.  No wheezes or rales.  HEART:  S1, S2.  No murmur, no gallop.  ABDOMEN:  Soft, nontender.  No organomegaly or masses felt.  LOWER EXTREMITIES:  Without edema.  MENTAL STATUS:  She is alert and cooperative.  Denies being depressed.  SKIN:  Clear.   LABORATORIES:  On December 01, 2006, CMET normal. CBC normal.  Cholesterol 251, HDL 57, LDL 197.  TSH normal.  Urinalysis normal.   ASSESSMENT AND PLAN:  1. Normal Wellness Examination.  Age/health-related issues discussed.      Healthy lifestyle discussed.  She is using folic acid anticipating      pregnancy.  Regular checkups with Dr. Vincente Poli.  2. Hypertension.  She is currently on methyldopa 250mg , 3 times a day.      Blood pressure is controlled.  3. Insomnia.  She is aware she will have to discontinue Ambien if      pregnant (category C).  She can try valerian root.  4. Migraine headaches.  If pregnant, she can use Tylenol p.r.n.  Other      pain medications are category C for pregnancy.  5. Dyslipidemia.  She can use fish oil.  Will recheck in a year.     Georgina Quint. Plotnikov, MD  Electronically Signed    AVP/MedQ  DD: 12/09/2006  DT: 12/09/2006  Job #: [id number]

## 2011-05-18 ENCOUNTER — Telehealth: Payer: Self-pay | Admitting: *Deleted

## 2011-05-18 MED ORDER — ESZOPICLONE 3 MG PO TABS: 3.0000 mg | ORAL_TABLET | Freq: Every evening | ORAL | Status: DC | PRN

## 2011-05-18 NOTE — Telephone Encounter (Signed)
rec Rf req for Lunesta 3 mg 1 po qhs. # 90. Last filled 04-08-11. Ok to Rf?

## 2011-05-18 NOTE — Telephone Encounter (Signed)
Pt called req this RF of lunesta - She is out of med, please advise.

## 2011-05-18 NOTE — Telephone Encounter (Signed)
Done, left vm for pt to check w/pharm  

## 2011-05-18 NOTE — Telephone Encounter (Signed)
OK to fill this prescription with additional refills x3 Thank you!  

## 2011-08-11 ENCOUNTER — Telehealth: Payer: Self-pay | Admitting: *Deleted

## 2011-08-11 NOTE — Telephone Encounter (Signed)
rf req for Lunesta 3 mg 1 po qhs. # 90. Last filled 07-15-11. Ok to Rf?

## 2011-08-11 NOTE — Telephone Encounter (Signed)
OK to fill this prescription with additional refills x1 Thank you!  

## 2011-08-12 MED ORDER — ESZOPICLONE 3 MG PO TABS: 3.0000 mg | ORAL_TABLET | Freq: Every evening | ORAL | Status: DC | PRN

## 2011-09-23 ENCOUNTER — Telehealth: Payer: Self-pay | Admitting: *Deleted

## 2011-09-23 NOTE — Telephone Encounter (Signed)
Pt left vm stating Lunesta 3 mg requires PA. Call [phone] Ref# [id number].

## 2011-09-24 NOTE — Telephone Encounter (Signed)
Coverage review for [name] is required because med is non formulary. Gave pertinent info to representative.   Med is approved from 09-03-11 until 09-23-2012.  Pt's husband and pharmacy informed.

## 2011-10-15 ENCOUNTER — Other Ambulatory Visit: Payer: Self-pay | Admitting: Internal Medicine

## 2011-10-26 ENCOUNTER — Encounter (HOSPITAL_COMMUNITY): Payer: Self-pay | Admitting: *Deleted

## 2011-10-26 ENCOUNTER — Inpatient Hospital Stay (HOSPITAL_COMMUNITY): Payer: BC Managed Care – PPO

## 2011-10-26 ENCOUNTER — Inpatient Hospital Stay (HOSPITAL_COMMUNITY)
Admission: AD | Admit: 2011-10-26 | Discharge: 2011-10-26 | Disposition: A | Discharge status: Home / Self Care | Payer: BC Managed Care – PPO | Source: Ambulatory Visit | Attending: Obstetrics and Gynecology | Admitting: Obstetrics and Gynecology

## 2011-10-26 DIAGNOSIS — J019 Acute sinusitis, unspecified: Secondary | ICD-10-CM

## 2011-10-26 DIAGNOSIS — E785 Hyperlipidemia, unspecified: Secondary | ICD-10-CM

## 2011-10-26 DIAGNOSIS — R7309 Other abnormal glucose: Secondary | ICD-10-CM

## 2011-10-26 DIAGNOSIS — R51 Headache: Secondary | ICD-10-CM

## 2011-10-26 DIAGNOSIS — R0989 Other specified symptoms and signs involving the circulatory and respiratory systems: Secondary | ICD-10-CM

## 2011-10-26 DIAGNOSIS — R209 Unspecified disturbances of skin sensation: Secondary | ICD-10-CM

## 2011-10-26 DIAGNOSIS — I10 Essential (primary) hypertension: Secondary | ICD-10-CM

## 2011-10-26 DIAGNOSIS — R1032 Left lower quadrant pain: Secondary | ICD-10-CM | POA: Insufficient documentation

## 2011-10-26 DIAGNOSIS — G4700 Insomnia, unspecified: Secondary | ICD-10-CM

## 2011-10-26 DIAGNOSIS — K219 Gastro-esophageal reflux disease without esophagitis: Secondary | ICD-10-CM

## 2011-10-26 HISTORY — DX: Nausea with vomiting, unspecified: R11.2

## 2011-10-26 HISTORY — DX: Essential (primary) hypertension: I10

## 2011-10-26 HISTORY — DX: Transplanted organ removal status: Z98.85

## 2011-10-26 HISTORY — DX: Other specified postprocedural states: Z98.890

## 2011-10-26 LAB — COMPREHENSIVE METABOLIC PANEL
Alkaline Phosphatase: 63 U/L (ref 39–117)
BUN: 12 mg/dL (ref 6–23)
CO2: 24 mEq/L (ref 19–32)
Calcium: 9.5 mg/dL (ref 8.4–10.5)
GFR calc Af Amer: >90 mL/min (ref >90)
GFR calc non Af Amer: >90 mL/min (ref >90)
Glucose, Bld: 98 mg/dL (ref 70–99)
Total Protein: 7.4 g/dL (ref 6.0–8.3)

## 2011-10-26 LAB — CBC
HCT: 35.6 % — ABNORMAL LOW (ref 36.0–46.0)
Hemoglobin: 11.9 g/dL — ABNORMAL LOW (ref 12.0–15.0)
MCH: 31.8 pg (ref 26.0–34.0)
MCHC: 33.4 g/dL (ref 30.0–36.0)
RBC: 3.74 MIL/uL — ABNORMAL LOW (ref 3.87–5.11)

## 2011-10-26 MED ORDER — SODIUM CHLORIDE 0.9 % IJ SOLN
INTRAMUSCULAR | Status: AC
  Filled 2011-10-26: qty 6

## 2011-10-26 MED ORDER — KETOROLAC TROMETHAMINE 30 MG/ML IJ SOLN
30.0000 mg | Freq: Once | INTRAMUSCULAR | Status: AC
  Administered 2011-10-26: 30 mg via INTRAVENOUS
  Filled 2011-10-26: qty 1

## 2011-10-26 MED ORDER — SODIUM CHLORIDE 0.9 % IJ SOLN
INTRAMUSCULAR | Status: AC
  Filled 2011-10-26: qty 3

## 2011-10-26 MED ORDER — IOHEXOL 300 MG/ML  SOLN
100.0000 mL | Freq: Once | INTRAMUSCULAR | Status: AC | PRN
  Administered 2011-10-26: 100 mL via INTRAVENOUS

## 2011-10-26 NOTE — ED Provider Notes (Addendum)
[name] is a 45 y.o. female who presents to MAU for blood work and CT scan of abdomen for LLQ abdominal pain. The patient was evaluated in the office prior to MAU visit. She has only one kidney so a CBC and CMP was ordered prior to the procedure. The labs are normal. I discussed with Dr. Vincente Poli. Will call her with results of CT. Medical screening exam complete and patient is stable to complete treatment and await further evaluation from Dr. Vincente Poli.  Kerrie Buffalo, NP 10/26/11 1816  Kerrie Buffalo, NP 10/26/11 1610

## 2011-10-26 NOTE — Discharge Instructions (Signed)
Call physician if any worsening symptoms and follow up with DR Vincente Poli

## 2011-10-26 NOTE — Progress Notes (Signed)
Patient states she started having abdominal pain about 4 days ago. Was seen in the office today and sent to MAU for further evaluation and a CT scan.

## 2011-10-26 NOTE — ED Notes (Signed)
Dondra Spry in radiology notified of pt. with 1 kidney. Mayer Camel, NP reviewed BUN and creatinine result. No need for further lab testing. Contrast started.

## 2011-11-05 ENCOUNTER — Encounter: Payer: Self-pay | Admitting: Internal Medicine

## 2011-11-05 ENCOUNTER — Ambulatory Visit (INDEPENDENT_AMBULATORY_CARE_PROVIDER_SITE_OTHER): Payer: BC Managed Care – PPO | Admitting: Internal Medicine

## 2011-11-05 DIAGNOSIS — I10 Essential (primary) hypertension: Secondary | ICD-10-CM

## 2011-11-05 DIAGNOSIS — Z905 Acquired absence of kidney: Secondary | ICD-10-CM | POA: Insufficient documentation

## 2011-11-05 MED ORDER — AMLODIPINE BESYLATE-VALSARTAN 5-320 MG PO TABS: 1.0000 | ORAL_TABLET | Freq: Every day | ORAL | Status: DC

## 2011-11-05 MED ORDER — FLUTICASONE PROPIONATE 50 MCG/ACT NA SUSP: 2.0000 | Freq: Every day | NASAL | Status: DC

## 2011-11-05 MED ORDER — AZITHROMYCIN 250 MG PO TABS: ORAL_TABLET | ORAL | Status: AC

## 2011-11-05 NOTE — Progress Notes (Signed)
  Subjective:    Patient ID: [name], female    DOB: [birth year], 45 y.o.   MRN: [id number]  HPI  C/o elev BP x 1 month She had a HA last night with a BP 170/90 - she took an extra Diovan She is bothered w/a fibroid She is sick w/a sinus x 1 mo - took a Zpac Review of Systems  Constitutional: Negative for chills, activity change, appetite change, fatigue and unexpected weight change.  HENT: Positive for congestion, rhinorrhea and postnasal drip. Negative for mouth sores and sinus pressure.   Eyes: Negative for visual disturbance.  Respiratory: Negative for cough and chest tightness.   Gastrointestinal: Negative for nausea and abdominal pain.  Genitourinary: Negative for frequency, difficulty urinating and vaginal pain.  Musculoskeletal: Negative for back pain and gait problem.  Skin: Negative for pallor and rash.  Neurological: Positive for headaches. Negative for dizziness, tremors, weakness and numbness.  Psychiatric/Behavioral: Negative for confusion and sleep disturbance.       Objective:   Physical Exam  Constitutional: She appears well-developed and well-nourished. No distress.  HENT:  Head: Normocephalic.  Right Ear: External ear normal.  Left Ear: External ear normal.       eryth nasal mucosa  Eyes: Conjunctivae are normal. Pupils are equal, round, and reactive to light. Right eye exhibits no discharge. Left eye exhibits no discharge.  Neck: Normal range of motion. Neck supple. No JVD present. No tracheal deviation present. No thyromegaly present.  Cardiovascular: Normal rate, regular rhythm and normal heart sounds.   Pulmonary/Chest: No stridor. No respiratory distress. She has no wheezes.  Abdominal: Soft. Bowel sounds are normal. She exhibits no distension and no mass. There is no tenderness. There is no rebound and no guarding.  Musculoskeletal: She exhibits no edema and no tenderness.  Lymphadenopathy:    She has no cervical adenopathy.  Neurological: She  displays normal reflexes. No cranial nerve deficit. She exhibits normal muscle tone. Coordination normal.  Skin: No rash noted. No erythema.  Psychiatric: She has a normal mood and affect. Her behavior is normal. Judgment and thought content normal.    Lab Results  Component Value Date   WBC 8.7 10/26/2011   HGB 11.9* 10/26/2011   HCT 35.6* 10/26/2011   PLT 336 10/26/2011   GLUCOSE 98 10/26/2011   CHOL 221* 10/03/2010   TRIG 88.0 10/03/2010   HDL 43.40 10/03/2010   LDLDIRECT 150.3 10/03/2010   ALT 16 10/26/2011   AST 16 10/26/2011   NA 135 10/26/2011   K 3.9 10/26/2011   CL 99 10/26/2011   CREATININE 0.72 10/26/2011   BUN 12 10/26/2011   CO2 24 10/26/2011   TSH 1.37 10/03/2010         Assessment & Plan:

## 2011-11-05 NOTE — Assessment & Plan Note (Signed)
Treat HTN Monitor creat

## 2011-11-05 NOTE — Assessment & Plan Note (Signed)
See meds Treat sinusitis

## 2011-11-27 ENCOUNTER — Telehealth: Payer: Self-pay | Admitting: *Deleted

## 2011-11-27 MED ORDER — AMLODIPINE BESYLATE-VALSARTAN 10-320 MG PO TABS: 1.0000 | ORAL_TABLET | Freq: Every day | ORAL | Status: DC

## 2011-11-27 NOTE — Telephone Encounter (Signed)
Take Exforge 320-10 1 a day - pick up samples and rx on Mon Thx

## 2011-11-27 NOTE — Telephone Encounter (Signed)
Can she take HCTZ? If yes, - pick up samples of Exforge hct 320-5-25 one a day and a rx Thx

## 2011-11-27 NOTE — Telephone Encounter (Signed)
No- she cant take HCTZ due to flying. Please advise

## 2011-11-27 NOTE — Telephone Encounter (Signed)
Pt states that her diastolic # is still elevated (around 90). Pt is asking MD to increase the dosage of Exforge to help with BP.

## 2011-12-02 NOTE — Telephone Encounter (Signed)
Pt informed already on 11-30-11.

## 2011-12-21 ENCOUNTER — Telehealth: Payer: Self-pay

## 2011-12-21 NOTE — Telephone Encounter (Signed)
Pt called stating that her BP is still uncontrolled with medication; 140/98. Pt would like to try alternative to Diovan, please advise.

## 2011-12-22 MED ORDER — CARVEDILOL 12.5 MG PO TABS: 12.5000 mg | ORAL_TABLET | Freq: Two times a day (BID) | ORAL | Status: DC

## 2011-12-22 NOTE — Telephone Encounter (Signed)
Pt informed

## 2011-12-22 NOTE — Telephone Encounter (Signed)
Pt calling again today re: below. Please advise.

## 2011-12-22 NOTE — Telephone Encounter (Signed)
Cont current Rx Add Coreg 12.5 mg bid Thx

## 2012-02-05 LAB — HM MAMMOGRAPHY

## 2012-02-08 ENCOUNTER — Telehealth: Payer: Self-pay | Admitting: *Deleted

## 2012-02-08 MED ORDER — ESZOPICLONE 3 MG PO TABS: 3.0000 mg | ORAL_TABLET | Freq: Every day | ORAL | Status: DC | PRN

## 2012-02-08 NOTE — Telephone Encounter (Signed)
Rf req for Lunesta 3 mg 1 po qhs. Ok to Rf in PCP absence?

## 2012-02-08 NOTE — Telephone Encounter (Signed)
Ok for refill  x3 

## 2012-06-27 ENCOUNTER — Other Ambulatory Visit: Payer: Self-pay | Admitting: Internal Medicine

## 2012-06-28 NOTE — Telephone Encounter (Signed)
Ok to Rf? 

## 2012-08-24 ENCOUNTER — Telehealth: Payer: Self-pay | Admitting: *Deleted

## 2012-08-24 NOTE — Telephone Encounter (Signed)
Rf req for Lunesta 3mg  1 po qhs. Last filled 07/29/12. Ok to Rf?

## 2012-08-24 NOTE — Telephone Encounter (Signed)
OK to fill this prescription with additional refills x2 Thank you!  

## 2012-08-25 MED ORDER — ESZOPICLONE 3 MG PO TABS: 3.0000 mg | ORAL_TABLET | Freq: Every day | ORAL | Status: DC | PRN

## 2012-08-25 NOTE — Telephone Encounter (Signed)
RX called in per MD. 

## 2012-09-30 ENCOUNTER — Telehealth: Payer: Self-pay | Admitting: *Deleted

## 2012-09-30 NOTE — Telephone Encounter (Signed)
Lunesta PA form faxed to ins. Will wait for ins decision.

## 2012-10-17 ENCOUNTER — Telehealth: Payer: Self-pay | Admitting: *Deleted

## 2012-10-17 NOTE — Telephone Encounter (Signed)
Lunesta PA approved from 09/09/2012-09/30/2013. Pt and pharmacy informed.

## 2012-10-17 NOTE — Telephone Encounter (Signed)
PA approved. Pt informed and pharmacy informed.

## 2012-10-26 ENCOUNTER — Other Ambulatory Visit: Payer: Self-pay | Admitting: Internal Medicine

## 2012-10-26 DIAGNOSIS — Z0000 Encounter for general adult medical examination without abnormal findings: Secondary | ICD-10-CM

## 2012-11-22 ENCOUNTER — Other Ambulatory Visit: Payer: Self-pay | Admitting: Internal Medicine

## 2012-11-25 ENCOUNTER — Other Ambulatory Visit: Payer: Self-pay | Admitting: Internal Medicine

## 2012-11-25 NOTE — Telephone Encounter (Signed)
Ok to Rf? 

## 2012-12-09 ENCOUNTER — Telehealth: Payer: Self-pay | Admitting: Internal Medicine

## 2012-12-09 NOTE — Telephone Encounter (Signed)
Caller: [name]/Patient; Phone: [phone]; Reason for Call: Patient calling about prior authorization for Lunesta.  States has active Rx which needs to be approved by insurance.  Patient is extremely frustrated that this has not been done since pharmacy requested it after 12/07/12.  States her [employer] coupon was not viable until 12/07/12.  Per Epic, no note in chart; patient states she did speak with after-hours RN 12/08/12 who sent a note to office.  RN can see this note in after-hours system.  Info to office for staff/provider attention in AM.  Krs/can

## 2012-12-12 ENCOUNTER — Telehealth: Payer: Self-pay | Admitting: Internal Medicine

## 2012-12-12 NOTE — Telephone Encounter (Signed)
Pt states she has left messages to let us know she needs a new PA on Eszopiclone or Lunesta for 2014 as of Jan 1 per her insurance [address].

## 2012-12-13 NOTE — Telephone Encounter (Signed)
PA form faxed on 12/12/12.

## 2012-12-13 NOTE — Telephone Encounter (Signed)
PA form was faxed on 12/13/11.

## 2012-12-15 ENCOUNTER — Other Ambulatory Visit: Payer: Self-pay | Admitting: Internal Medicine

## 2012-12-20 ENCOUNTER — Ambulatory Visit (INDEPENDENT_AMBULATORY_CARE_PROVIDER_SITE_OTHER): Payer: BC Managed Care – PPO | Admitting: Internal Medicine

## 2012-12-20 ENCOUNTER — Other Ambulatory Visit (INDEPENDENT_AMBULATORY_CARE_PROVIDER_SITE_OTHER): Payer: BC Managed Care – PPO

## 2012-12-20 ENCOUNTER — Encounter: Payer: Self-pay | Admitting: Internal Medicine

## 2012-12-20 ENCOUNTER — Telehealth: Payer: Self-pay | Admitting: *Deleted

## 2012-12-20 VITALS — BP 110/70 | HR 80 | Temp 98.4°F | Resp 16 | Ht 64.0 in | Wt 168.0 lb

## 2012-12-20 DIAGNOSIS — Z0000 Encounter for general adult medical examination without abnormal findings: Secondary | ICD-10-CM

## 2012-12-20 DIAGNOSIS — I10 Essential (primary) hypertension: Secondary | ICD-10-CM

## 2012-12-20 DIAGNOSIS — G4700 Insomnia, unspecified: Secondary | ICD-10-CM

## 2012-12-20 DIAGNOSIS — H1130 Conjunctival hemorrhage, unspecified eye: Secondary | ICD-10-CM

## 2012-12-20 DIAGNOSIS — D485 Neoplasm of uncertain behavior of skin: Secondary | ICD-10-CM | POA: Insufficient documentation

## 2012-12-20 LAB — COMPREHENSIVE METABOLIC PANEL
AST: 17 U/L (ref 0–37)
Albumin: 4.1 g/dL (ref 3.5–5.2)
Alkaline Phosphatase: 43 U/L (ref 39–117)
Glucose, Bld: 107 mg/dL — ABNORMAL HIGH (ref 70–99)
Potassium: 4.1 mEq/L (ref 3.5–5.1)
Sodium: 138 mEq/L (ref 135–145)
Total Bilirubin: 0.6 mg/dL (ref 0.3–1.2)
Total Protein: 7.6 g/dL (ref 6.0–8.3)

## 2012-12-20 LAB — LDL CHOLESTEROL, DIRECT: Direct LDL: 149.2 mg/dL

## 2012-12-20 LAB — URINALYSIS, ROUTINE W REFLEX MICROSCOPIC
Ketones, ur: NEGATIVE
Urine Glucose: NEGATIVE
Urobilinogen, UA: 0.2 (ref 0.0–1.0)

## 2012-12-20 LAB — CBC WITH DIFFERENTIAL/PLATELET
Eosinophils Absolute: 0.1 10*3/uL (ref 0.0–0.7)
Eosinophils Relative: 1.9 % (ref 0.0–5.0)
HCT: 36 % (ref 36.0–46.0)
Lymphs Abs: 1.6 10*3/uL (ref 0.7–4.0)
MCHC: 34.2 g/dL (ref 30.0–36.0)
MCV: 93.8 fl (ref 78.0–100.0)
Monocytes Absolute: 0.4 10*3/uL (ref 0.1–1.0)
Neutrophils Relative %: 62.9 % (ref 43.0–77.0)
Platelets: 321 10*3/uL (ref 150.0–400.0)
RDW: 13.1 % (ref 11.5–14.6)

## 2012-12-20 LAB — LIPID PANEL
Cholesterol: 221 mg/dL — ABNORMAL HIGH (ref 0–200)
HDL: 44.8 mg/dL (ref >39.00)
Total CHOL/HDL Ratio: 5
VLDL: 25.8 mg/dL (ref 0.0–40.0)

## 2012-12-20 LAB — TSH: TSH: 1.01 u[IU]/mL (ref 0.35–5.50)

## 2012-12-20 MED ORDER — AMLODIPINE BESYLATE-VALSARTAN 10-320 MG PO TABS: 1.0000 | ORAL_TABLET | Freq: Every day | ORAL | Status: DC

## 2012-12-20 MED ORDER — CARVEDILOL 12.5 MG PO TABS: 12.5000 mg | ORAL_TABLET | Freq: Two times a day (BID) | ORAL | Status: DC

## 2012-12-20 MED ORDER — NITROFURANTOIN MONOHYD MACRO 100 MG PO CAPS: 100.0000 mg | ORAL_CAPSULE | Freq: Two times a day (BID) | ORAL | Status: DC

## 2012-12-20 MED ORDER — ONDANSETRON 8 MG PO TBDP: 8.0000 mg | ORAL_TABLET | ORAL | Status: AC | PRN

## 2012-12-20 MED ORDER — ESZOPICLONE 3 MG PO TABS: 3.0000 mg | ORAL_TABLET | Freq: Every day | ORAL | Status: DC

## 2012-12-20 MED ORDER — LORAZEPAM 1 MG PO TABS: 1.0000 mg | ORAL_TABLET | Freq: Two times a day (BID) | ORAL | Status: DC | PRN

## 2012-12-20 NOTE — Telephone Encounter (Signed)
Ok INR dx subconj hemorrhage  Thx

## 2012-12-20 NOTE — Telephone Encounter (Signed)
[name], please, inform patient that all labs are normal except for a UTI and a slightely elev chol. Urine cx Take her abx or wait till we get a cx report Thx

## 2012-12-20 NOTE — Assessment & Plan Note (Signed)
Continue with current prescription therapy as reflected on the Med list.  

## 2012-12-20 NOTE — Progress Notes (Signed)
  Subjective:    HPI  The patient is here for a wellness exam. The patient has been doing well overall without major physical or psychological issues going on lately, except for working a lot  The patient needs to address  chronic hypertension that has been well controlled with medicines C/o stress - working a lot  She had a subconj hemorrhage on the L saw her optometrist    Review of Systems  Constitutional: Negative for chills, activity change, appetite change, fatigue and unexpected weight change.  HENT: Positive for congestion, rhinorrhea and postnasal drip. Negative for mouth sores and sinus pressure.   Eyes: Negative for visual disturbance.  Respiratory: Negative for cough and chest tightness.   Gastrointestinal: Negative for nausea and abdominal pain.  Genitourinary: Negative for frequency, difficulty urinating and vaginal pain.  Musculoskeletal: Negative for back pain and gait problem.  Skin: Negative for pallor and rash.  Neurological: Positive for headaches. Negative for dizziness, tremors, weakness and numbness.  Psychiatric/Behavioral: Negative for confusion and sleep disturbance. The patient is nervous/anxious.        Objective:   Physical Exam  Constitutional: She appears well-developed and well-nourished. No distress.  HENT:  Head: Normocephalic.  Right Ear: External ear normal.  Left Ear: External ear normal.       eryth nasal mucosa  Eyes: Conjunctivae normal are normal. Pupils are equal, round, and reactive to light. Right eye exhibits no discharge. Left eye exhibits no discharge.  Neck: Normal range of motion. Neck supple. No JVD present. No tracheal deviation present. No thyromegaly present.  Cardiovascular: Normal rate, regular rhythm and normal heart sounds.   Pulmonary/Chest: No stridor. No respiratory distress. She has no wheezes.  Abdominal: Soft. Bowel sounds are normal. She exhibits no distension and no mass. There is no tenderness. There is no  rebound and no guarding.  Musculoskeletal: She exhibits no edema and no tenderness.  Lymphadenopathy:    She has no cervical adenopathy.  Neurological: She displays normal reflexes. No cranial nerve deficit. She exhibits normal muscle tone. Coordination normal.  Skin: No rash noted. No erythema.       Moles on B UEs and back  Psychiatric: She has a normal mood and affect. Her behavior is normal. Judgment and thought content normal.  Moles on UEs and back L subconj hem -    Lab Results  Component Value Date   WBC 8.7 10/26/2011   HGB 11.9* 10/26/2011   HCT 35.6* 10/26/2011   PLT 336 10/26/2011   GLUCOSE 98 10/26/2011   CHOL 221* 10/03/2010   TRIG 88.0 10/03/2010   HDL 43.40 10/03/2010   LDLDIRECT 150.3 10/03/2010   ALT 16 10/26/2011   AST 16 10/26/2011   NA 135 10/26/2011   K 3.9 10/26/2011   CL 99 10/26/2011   CREATININE 0.72 10/26/2011   BUN 12 10/26/2011   CO2 24 10/26/2011   TSH 1.37 10/03/2010         Assessment & Plan:

## 2012-12-20 NOTE — Telephone Encounter (Signed)
[name] calling stating pt requests to have a blue top on her because she has been having vessels to bust in her eye. Please advise would you like anything tested for this condition or should specimen be tossed?

## 2012-12-20 NOTE — Assessment & Plan Note (Signed)
We discussed age appropriate health related issues, including available/recomended screening tests and vaccinations. We discussed a need for adhering to healthy diet and exercise. Labs/EKG were reviewed/ordered. All questions were answered.  PAP w/gyn [employer]

## 2012-12-20 NOTE — Assessment & Plan Note (Signed)
Derm cons 

## 2012-12-20 NOTE — Telephone Encounter (Signed)
Yes, she request PT/INR. Ok? If so, what is dx?

## 2012-12-20 NOTE — Assessment & Plan Note (Signed)
Better Continue with current prescription therapy as reflected on the Med list.  

## 2012-12-20 NOTE — Telephone Encounter (Signed)
Is it for inr? Thx

## 2012-12-21 NOTE — Telephone Encounter (Signed)
Pt informed

## 2012-12-23 ENCOUNTER — Encounter: Payer: Self-pay | Admitting: Internal Medicine

## 2012-12-24 ENCOUNTER — Telehealth: Payer: Self-pay | Admitting: Internal Medicine

## 2012-12-24 NOTE — Telephone Encounter (Signed)
[name], please, inform patient that her INR was a little up 1.1 - pls repeat in 1 mo UA in 1 mo Thx

## 2012-12-26 NOTE — Telephone Encounter (Signed)
Pt informed

## 2013-01-21 ENCOUNTER — Other Ambulatory Visit: Payer: Self-pay | Admitting: Internal Medicine

## 2013-02-07 ENCOUNTER — Other Ambulatory Visit: Payer: Self-pay | Admitting: Internal Medicine

## 2013-03-13 ENCOUNTER — Other Ambulatory Visit: Payer: Self-pay | Admitting: Internal Medicine

## 2013-03-16 ENCOUNTER — Other Ambulatory Visit: Payer: Self-pay | Admitting: Internal Medicine

## 2013-03-17 NOTE — Telephone Encounter (Signed)
Notified pharmacy spoke with [name] gave md approval...lmb

## 2013-04-17 ENCOUNTER — Other Ambulatory Visit: Payer: Self-pay | Admitting: Internal Medicine

## 2013-04-19 ENCOUNTER — Telehealth: Payer: Self-pay | Admitting: Internal Medicine

## 2013-04-19 NOTE — Telephone Encounter (Signed)
Patient requesting refill on Ativan be sent to [facility] [address]

## 2013-04-19 NOTE — Telephone Encounter (Signed)
OK to fill this prescription with additional refills x1 Thank you!  

## 2013-04-20 MED ORDER — LORAZEPAM 1 MG PO TABS: 1.0000 mg | ORAL_TABLET | Freq: Two times a day (BID) | ORAL | Status: DC | PRN

## 2013-04-20 NOTE — Telephone Encounter (Signed)
Done

## 2013-06-21 ENCOUNTER — Other Ambulatory Visit: Payer: Self-pay | Admitting: Internal Medicine

## 2013-06-22 NOTE — Telephone Encounter (Signed)
Refill request for lorazepam Last filled by MD on - 04/20/13 #60 x1 Last seen- 12/23/12 F/U- none Please advise refill?

## 2013-06-26 ENCOUNTER — Other Ambulatory Visit: Payer: Self-pay | Admitting: Internal Medicine

## 2013-06-26 NOTE — Telephone Encounter (Signed)
Pt called again.  She is out of the medicine.

## 2013-06-26 NOTE — Telephone Encounter (Signed)
Please advise on Rf

## 2013-06-30 ENCOUNTER — Other Ambulatory Visit: Payer: Self-pay | Admitting: Internal Medicine

## 2013-10-12 ENCOUNTER — Other Ambulatory Visit: Payer: Self-pay

## 2014-06-21 ENCOUNTER — Other Ambulatory Visit: Payer: Self-pay | Admitting: Obstetrics and Gynecology

## 2014-06-21 DIAGNOSIS — R928 Other abnormal and inconclusive findings on diagnostic imaging of breast: Secondary | ICD-10-CM

## 2014-06-28 ENCOUNTER — Ambulatory Visit
Admission: RE | Admit: 2014-06-28 | Discharge: 2014-06-28 | Disposition: A | Discharge status: Home / Self Care | Payer: BC Managed Care – PPO | Source: Ambulatory Visit | Attending: Obstetrics and Gynecology | Admitting: Obstetrics and Gynecology

## 2014-06-28 ENCOUNTER — Other Ambulatory Visit: Payer: Self-pay | Admitting: Obstetrics and Gynecology

## 2014-06-28 DIAGNOSIS — R928 Other abnormal and inconclusive findings on diagnostic imaging of breast: Secondary | ICD-10-CM

## 2014-06-29 ENCOUNTER — Other Ambulatory Visit: Payer: Self-pay | Admitting: Obstetrics and Gynecology

## 2014-06-29 DIAGNOSIS — N63 Unspecified lump in unspecified breast: Secondary | ICD-10-CM

## 2014-06-29 DIAGNOSIS — R928 Other abnormal and inconclusive findings on diagnostic imaging of breast: Secondary | ICD-10-CM

## 2014-07-06 ENCOUNTER — Ambulatory Visit
Admission: RE | Admit: 2014-07-06 | Discharge: 2014-07-06 | Disposition: A | Discharge status: Home / Self Care | Payer: BC Managed Care – PPO | Source: Ambulatory Visit | Attending: Obstetrics and Gynecology | Admitting: Obstetrics and Gynecology

## 2014-07-06 ENCOUNTER — Other Ambulatory Visit: Payer: BC Managed Care – PPO

## 2014-07-06 DIAGNOSIS — R928 Other abnormal and inconclusive findings on diagnostic imaging of breast: Secondary | ICD-10-CM

## 2014-07-06 DIAGNOSIS — N63 Unspecified lump in unspecified breast: Secondary | ICD-10-CM

## 2015-06-05 IMAGING — MG MM DIAGNOSTIC UNILATERAL L
2 series · 2 of 2 positions shown · non-contrast
Comparison: Previous exams

CLINICAL DATA: Post left breast ultrasound-guided core biopsy.

EXAM:
DIAGNOSTIC left breast MAMMOGRAM POST ULTRASOUND BIOPSY

[L CC]
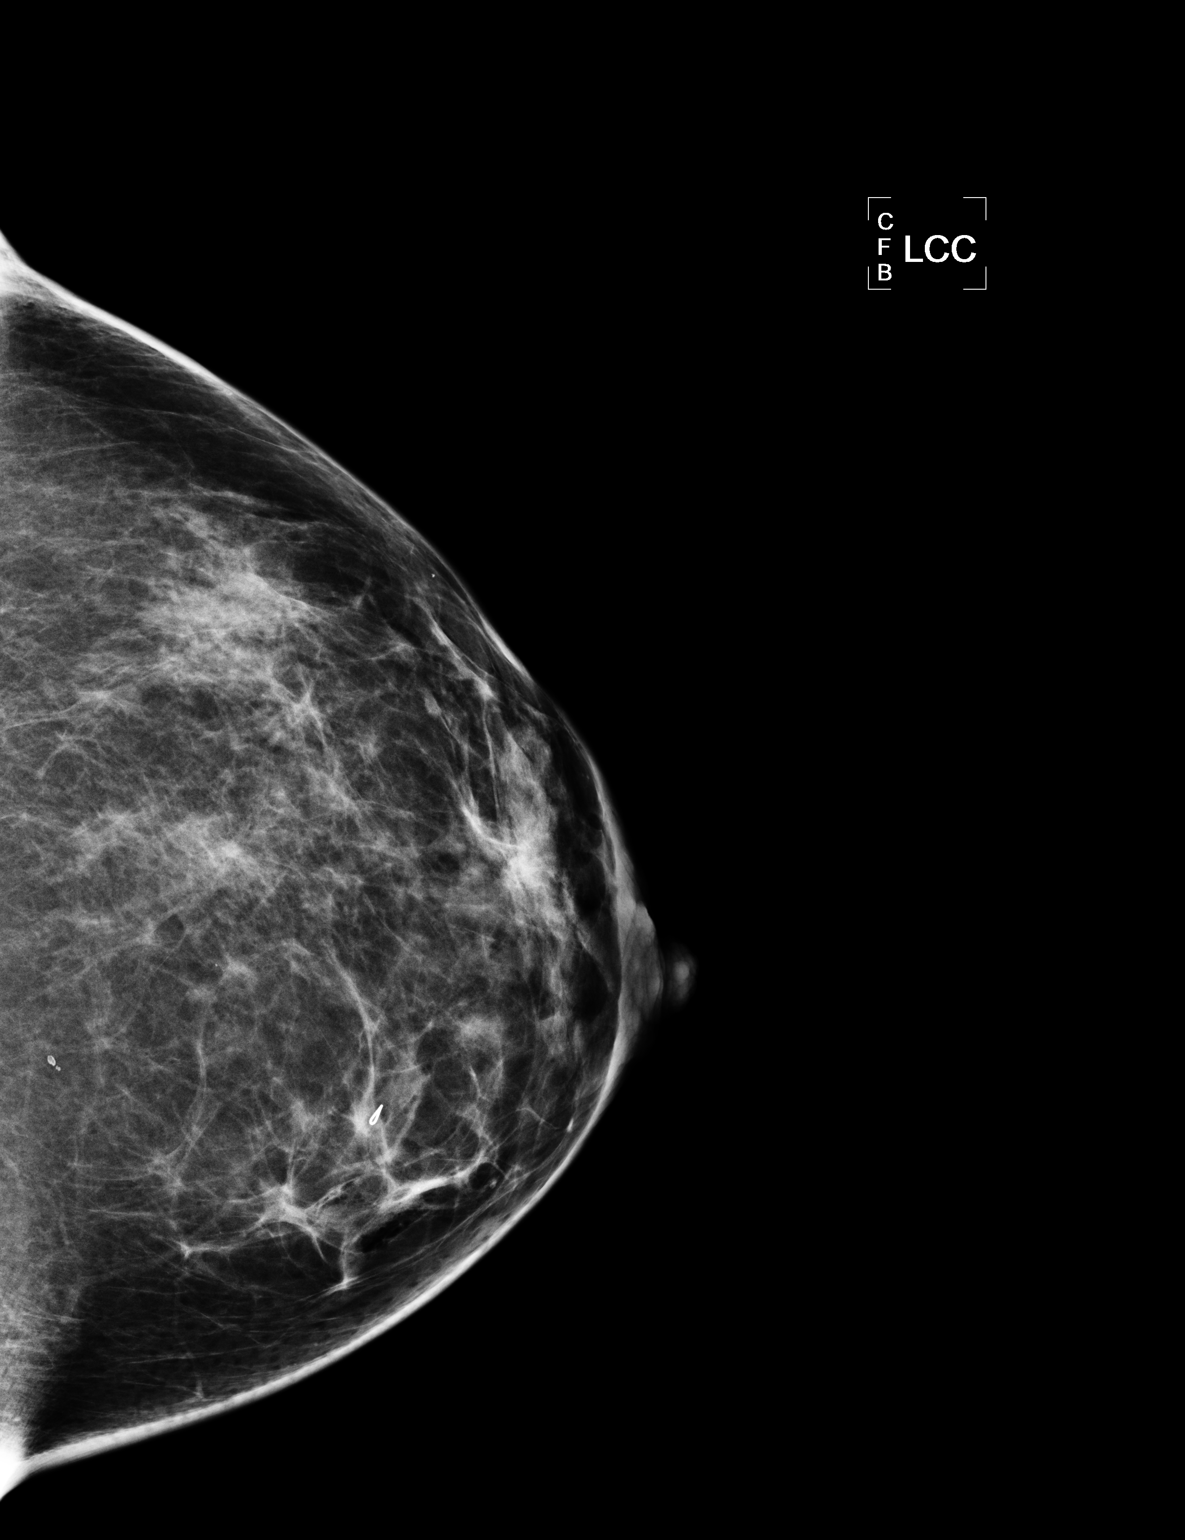

[L ML]
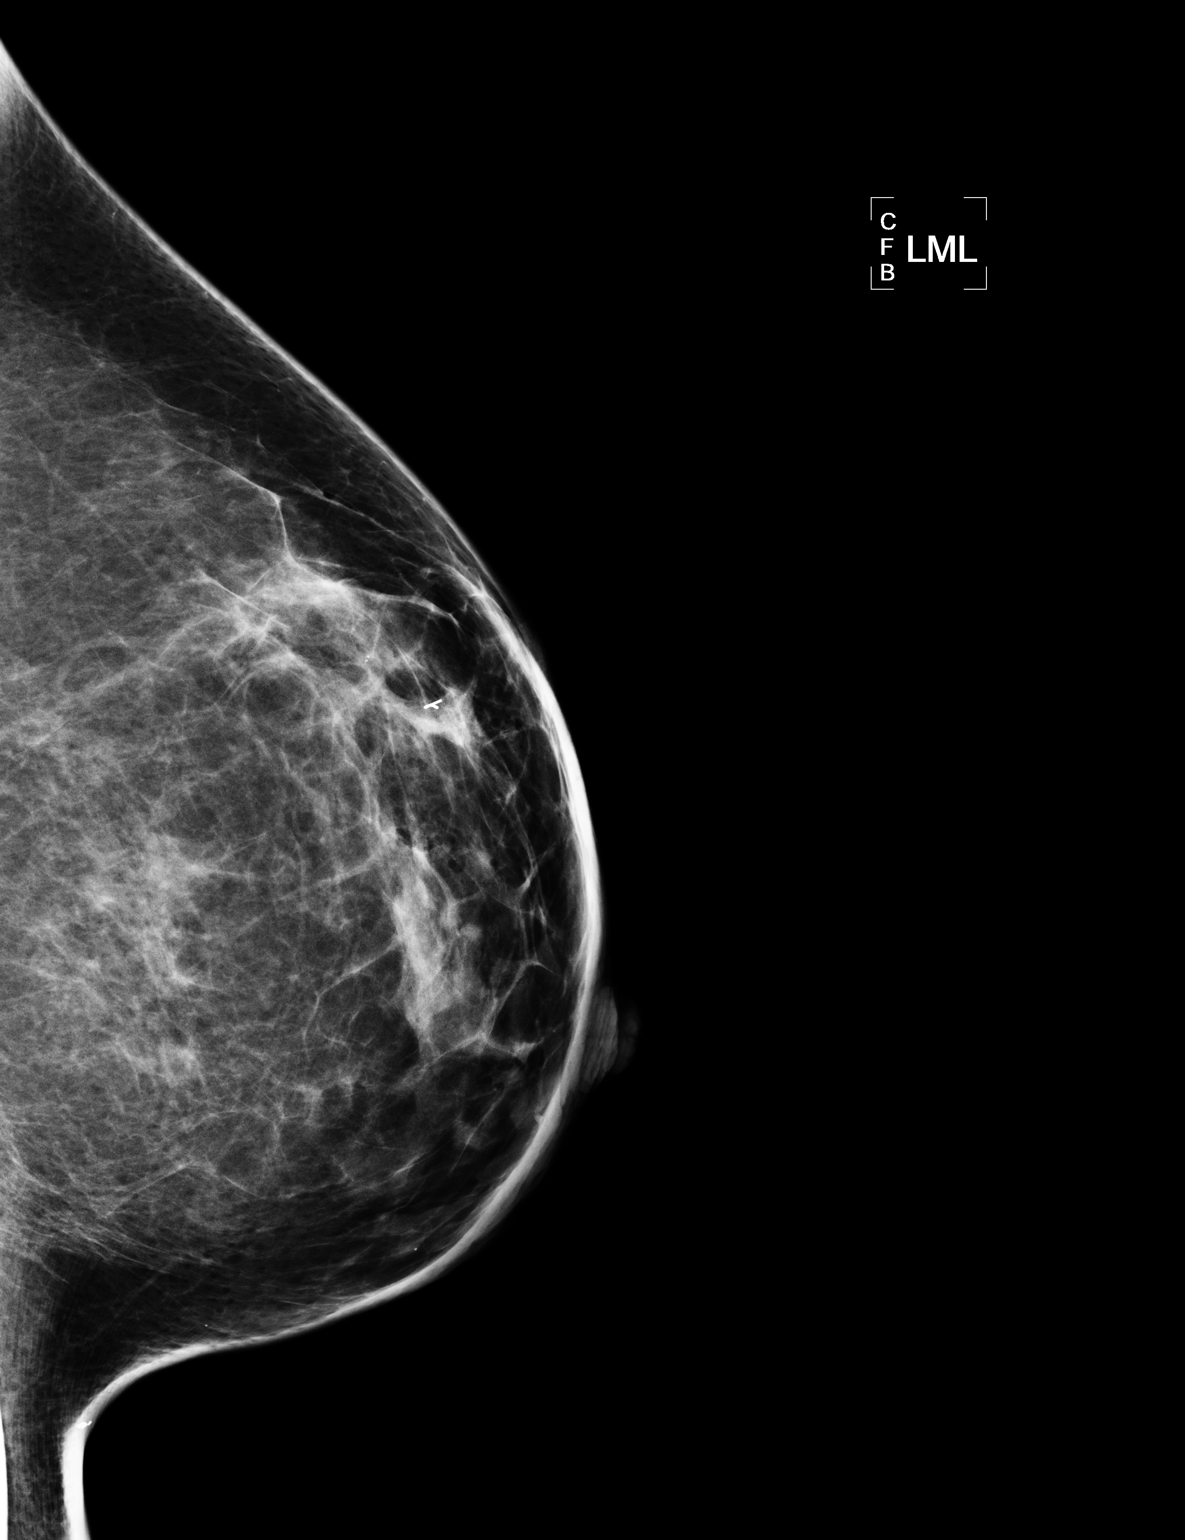

[2 of 2 positions shown; findings below may reference images not displayed]

FINDINGS: Mammographic images were obtained following ultrasound guided biopsy
of mass located within the left breast at the 11 o'clock position 3
cm from the nipple. The ribbon shaped clip is in appropriate
position.
IMPRESSION: Appropriate positioning of the ribbon shaped clip following left
breast ultrasound-guided core biopsy.

Final Assessment: Post Procedure Mammograms for Marker Placement

## 2015-08-08 ENCOUNTER — Other Ambulatory Visit: Payer: Self-pay | Admitting: Obstetrics and Gynecology

## 2015-08-09 LAB — CYTOLOGY - PAP

## 2015-08-14 ENCOUNTER — Other Ambulatory Visit: Payer: Self-pay | Admitting: Obstetrics and Gynecology

## 2015-08-14 DIAGNOSIS — R928 Other abnormal and inconclusive findings on diagnostic imaging of breast: Secondary | ICD-10-CM

## 2015-08-20 ENCOUNTER — Other Ambulatory Visit: Payer: Self-pay | Admitting: Obstetrics and Gynecology

## 2015-08-20 ENCOUNTER — Ambulatory Visit
Admission: RE | Admit: 2015-08-20 | Discharge: 2015-08-20 | Disposition: A | Discharge status: Home / Self Care | Payer: BC Managed Care – PPO | Source: Ambulatory Visit | Attending: Obstetrics and Gynecology | Admitting: Obstetrics and Gynecology

## 2015-08-20 DIAGNOSIS — R928 Other abnormal and inconclusive findings on diagnostic imaging of breast: Secondary | ICD-10-CM

## 2015-08-23 ENCOUNTER — Other Ambulatory Visit: Payer: Self-pay | Admitting: Obstetrics and Gynecology

## 2015-08-23 DIAGNOSIS — R928 Other abnormal and inconclusive findings on diagnostic imaging of breast: Secondary | ICD-10-CM

## 2015-08-27 ENCOUNTER — Ambulatory Visit
Admission: RE | Admit: 2015-08-27 | Discharge: 2015-08-27 | Disposition: A | Discharge status: Home / Self Care | Payer: BC Managed Care – PPO | Source: Ambulatory Visit | Attending: Obstetrics and Gynecology | Admitting: Obstetrics and Gynecology

## 2015-08-27 DIAGNOSIS — R928 Other abnormal and inconclusive findings on diagnostic imaging of breast: Secondary | ICD-10-CM

## 2016-07-19 IMAGING — US US BREAST LTD UNI RIGHT INC AXILLA
1 series · 7 of 7 positions shown · non-contrast
Comparison: 08/08/2015, 07/06/2014, 06/18/2014, 03/31/2013,
02/10/2012.

CLINICAL DATA: Recall from screening mammogram. History of a
previous left breast benign concordant ultrasound-guided core
biopsy.

EXAM:
ULTRASOUND OF THE RIGHT BREAST

[Series 1: advbreast · 7 of 7 slices shown]
[im 1/7]
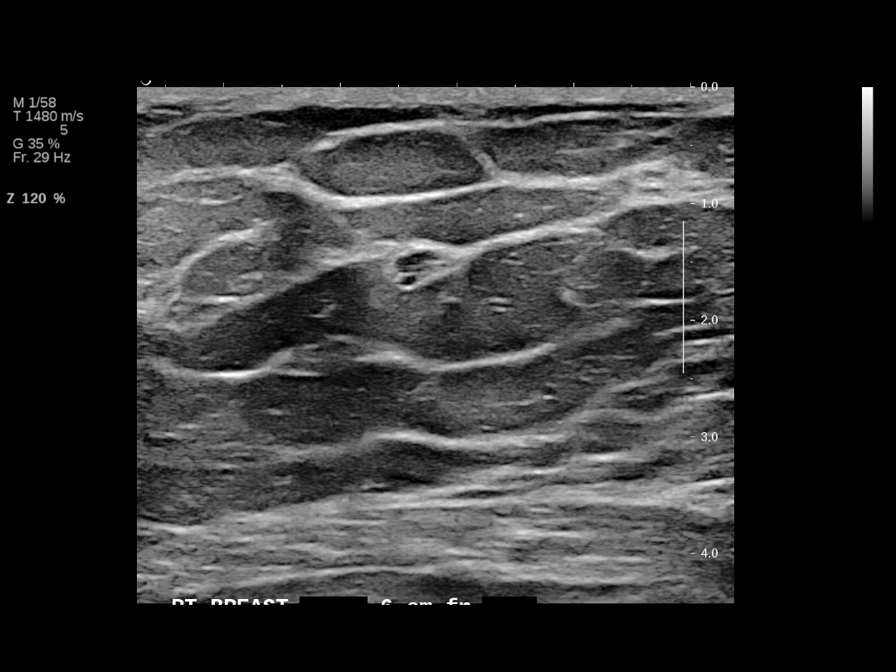
[im 2/7]
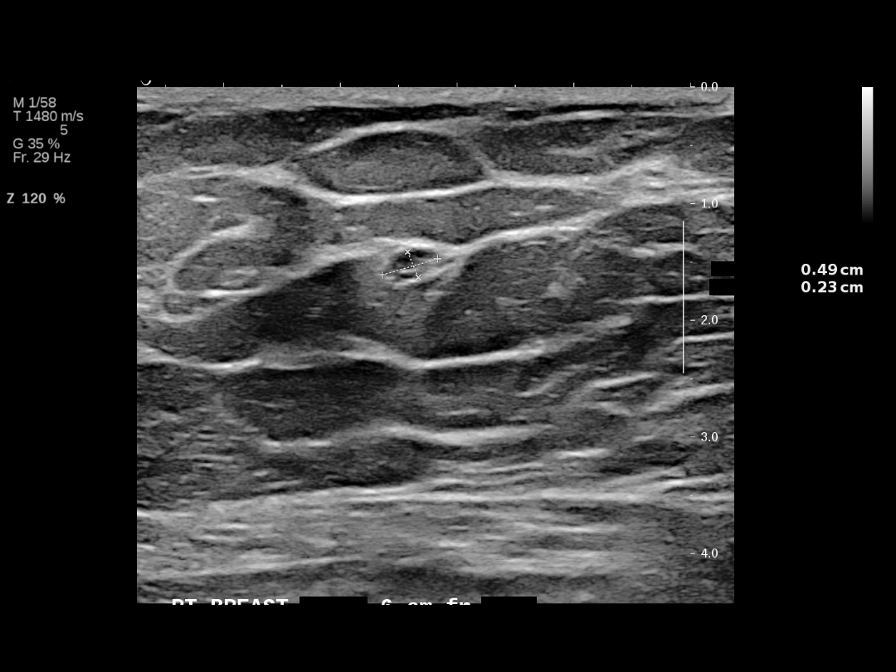
[im 3/7]
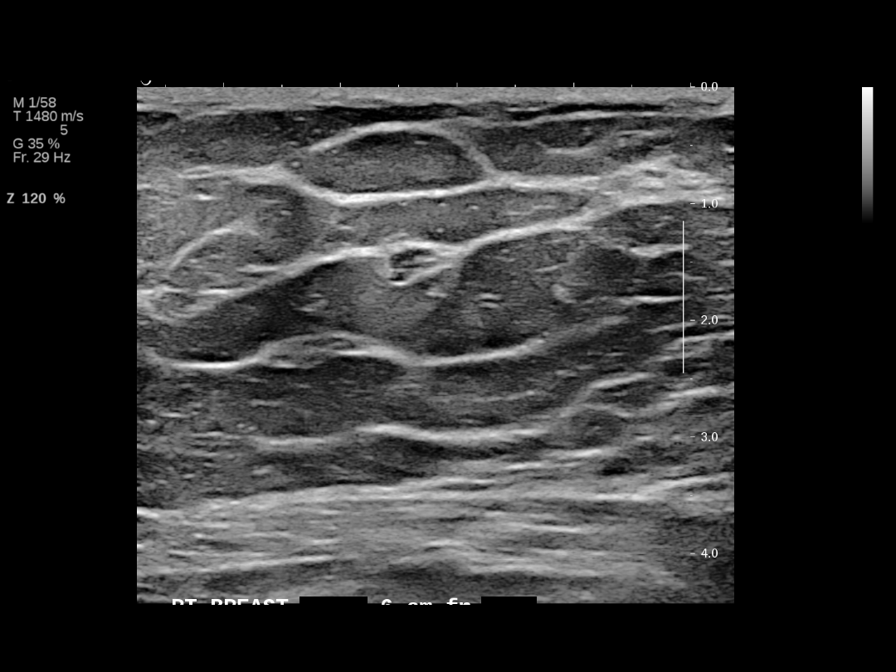
[im 4/7]
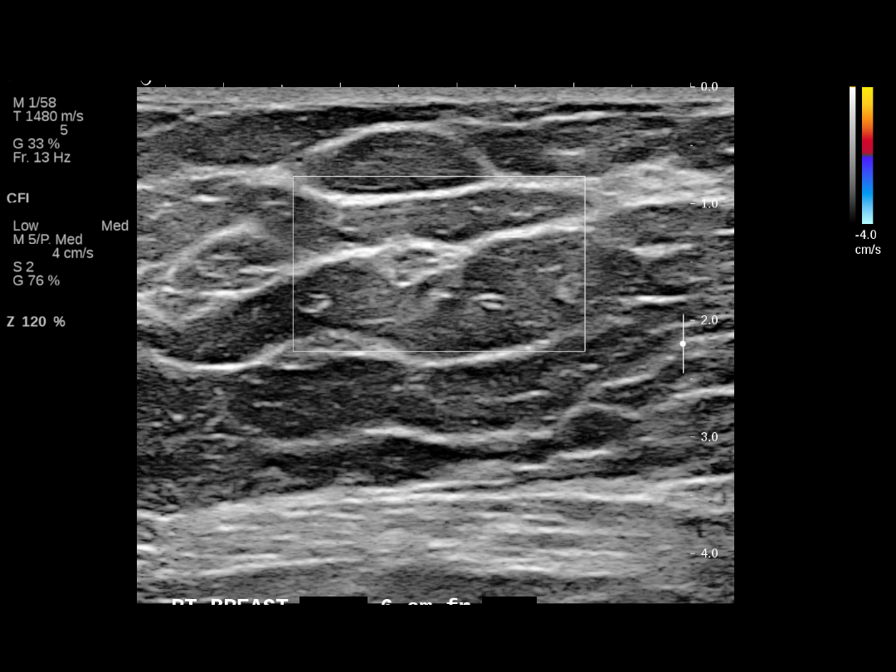
[im 5/7]
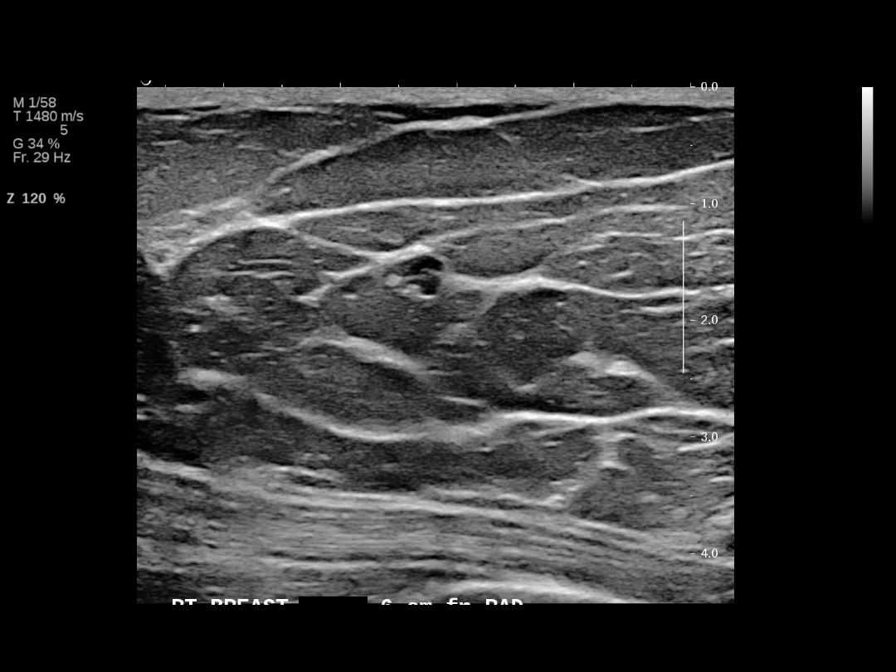
[im 6/7]
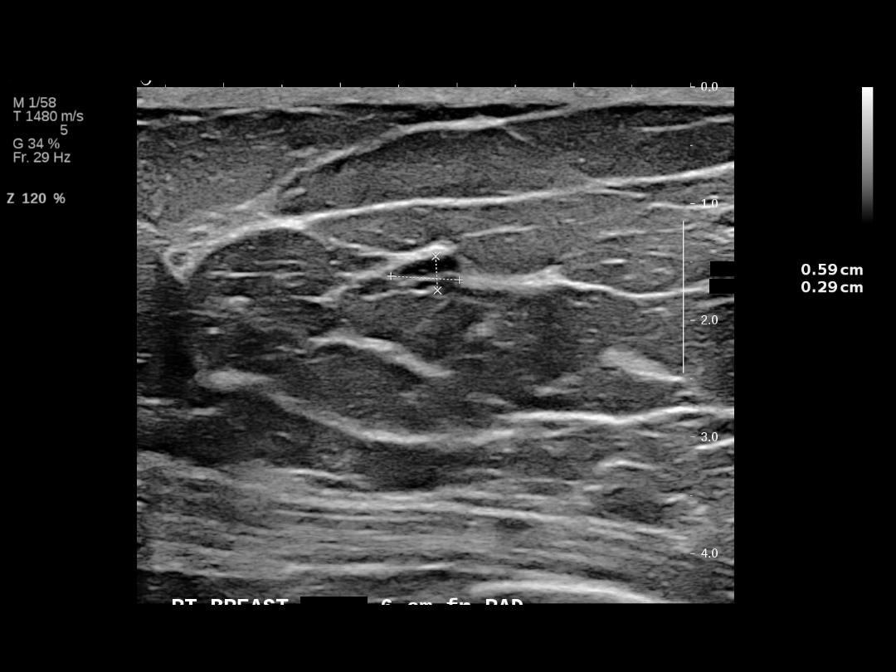
[im 7/7]
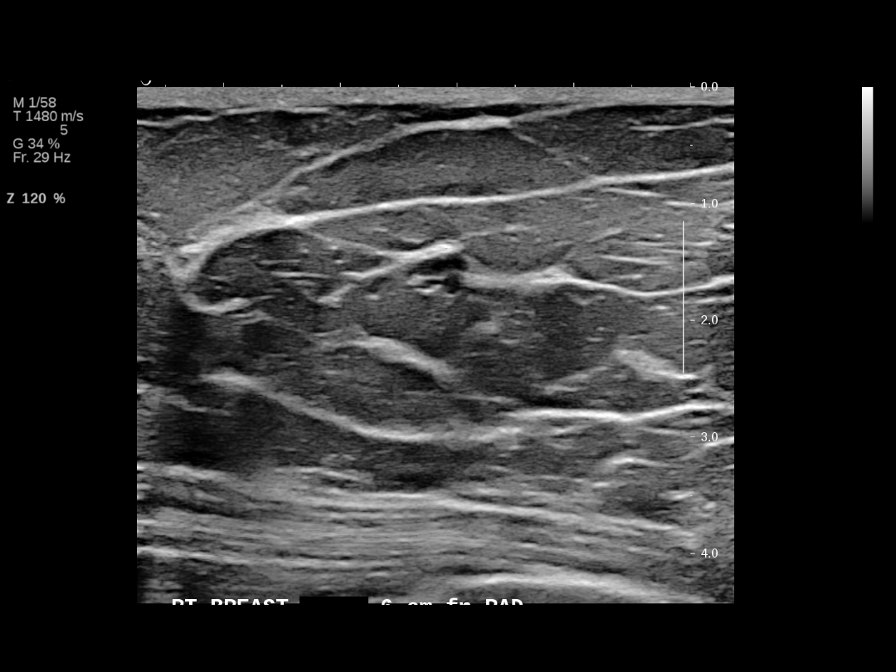

[7 of 7 positions shown; findings below may reference images not displayed]

FINDINGS: On physical exam, there is no discrete palpable abnormality in the
area of mammographic concern noted on the recent screening
mammogram.

Targeted ultrasound is performed, showing an oval, circumscribed,
hypoechoic lesion with multiple thin septations located within the
right breast at the 11 o'clock position 6 cm from the nipple
corresponding to the mammographic finding noted on the recent
screening mammogram. There is no internal color flow associated with
this lesion. This measures 6 x 3 x 5 mm in size. Most likely this
represents a fibrocystic complex / apocrine metaplasia. I have
discussed options of 6, 12, and 24 month imaging followup evaluation
versus tissue sampling via ultrasound-guided core biopsy. Following
this discussion, the patient prefers tissue sampling via
ultrasound-guided core biopsy and this is scheduled for 08/27/2015.
IMPRESSION: 6 mm probably benign lesion (probable area of developing apocrine
metaplasia/fibrocystic complex) located within the right breast at
the 11 o'clock position 6 cm from the nipple. Following discussion,
tissue sampling via ultrasound core biopsy is arranged for
08/27/2015.

RECOMMENDATION:
Right breast ultrasound-guided core biopsy (please see above
discussion).

I have discussed the findings and recommendations with the patient.
Results were also provided in writing at the conclusion of the
visit. If applicable, a reminder letter will be sent to the patient
regarding the next appointment.

BI-RADS CATEGORY  3: Probably benign.

## 2019-02-16 ENCOUNTER — Other Ambulatory Visit: Payer: Self-pay | Admitting: Obstetrics and Gynecology

## 2019-02-16 DIAGNOSIS — R928 Other abnormal and inconclusive findings on diagnostic imaging of breast: Secondary | ICD-10-CM

## 2019-02-17 ENCOUNTER — Ambulatory Visit: Payer: BC Managed Care – PPO

## 2019-02-17 ENCOUNTER — Ambulatory Visit
Admission: RE | Admit: 2019-02-17 | Discharge: 2019-02-17 | Disposition: A | Discharge status: Home / Self Care | Payer: BC Managed Care – PPO | Source: Ambulatory Visit | Attending: Obstetrics and Gynecology | Admitting: Obstetrics and Gynecology

## 2019-02-17 ENCOUNTER — Other Ambulatory Visit: Payer: Self-pay

## 2019-02-17 DIAGNOSIS — R928 Other abnormal and inconclusive findings on diagnostic imaging of breast: Secondary | ICD-10-CM

## 2019-02-27 ENCOUNTER — Other Ambulatory Visit: Payer: BC Managed Care – PPO

## 2019-05-10 ENCOUNTER — Ambulatory Visit: Payer: Managed Care, Other (non HMO) | Admitting: *Deleted

## 2019-05-10 ENCOUNTER — Other Ambulatory Visit: Payer: Self-pay

## 2019-05-10 ENCOUNTER — Telehealth: Payer: Self-pay | Admitting: Gastroenterology

## 2019-05-10 ENCOUNTER — Telehealth: Payer: Self-pay | Admitting: *Deleted

## 2019-05-10 VITALS — Ht 64.0 in | Wt 170.0 lb

## 2019-05-10 DIAGNOSIS — Z1211 Encounter for screening for malignant neoplasm of colon: Secondary | ICD-10-CM

## 2019-05-10 MED ORDER — PEG 3350-KCL-NA BICARB-NACL 420 G PO SOLR: 4000.0000 mL | Freq: Once | ORAL | 0 refills | Status: AC

## 2019-05-10 NOTE — Telephone Encounter (Signed)
Message left for pt. R/t pre visit.

## 2019-05-10 NOTE — Telephone Encounter (Signed)
I spoke with [name] in previsit and she opened a spot at 2 pm for the pt.  Pt notified and will be expecting a call.

## 2019-05-10 NOTE — Progress Notes (Signed)
Patient's pre-visit was done today over the phone with the patient due to Covid-19. Name,DOB and address verified. Insurance verified. Packet of Prep instructions mailed to patient including copy of a consent form and pre-procedure patient acknowledgement form-pt is aware. Patient understands to call us back with any questions or concerns.  Patient will email insurance card to [place].    Patient denies any allergies to eggs or soy. Patient denies any problems with anesthesia/sedation. Patient denies any oxygen use at home. Patient denies taking any diet/weight loss medications or blood thinners. EMMI education assisgned to patient on colonoscopy, this was explained and instructions given to patient.

## 2019-05-10 NOTE — Telephone Encounter (Signed)
Patient called in because she missed her previsit  With the previsit nurse. I made a called to the previsit nurse to see if she can speak with the patient but was not able and advised that the patient had to reschedule. I tried to reschedule the patient but the next available date was after her colonoscopy date and advised that we will have to change the procedure date. The patient refused.

## 2019-05-10 NOTE — Telephone Encounter (Signed)
Pt. Called back at 10:50 am unable to complete instructions, instructed to reschedule pre visit.

## 2019-05-17 ENCOUNTER — Telehealth: Payer: Self-pay | Admitting: *Deleted

## 2019-05-17 NOTE — Telephone Encounter (Signed)
LMOM, will call back

## 2019-05-18 ENCOUNTER — Telehealth: Payer: Self-pay

## 2019-05-18 NOTE — Telephone Encounter (Signed)
Covid-19 screening questions  Have you traveled in the last 14 days? No. If yes where?  Do you now or have you had a fever in the last 14 days? No.  Do you have any respiratory symptoms of shortness of breath or cough now or in the last 14 days? No.  Do you have any family members or close contacts with diagnosed or suspected Covid-19 in the past 14 days? No.  Have you been tested for Covid-19 and found to be positive? No.       

## 2019-05-19 ENCOUNTER — Telehealth: Payer: Self-pay | Admitting: *Deleted

## 2019-05-19 ENCOUNTER — Ambulatory Visit (AMBULATORY_SURGERY_CENTER): Payer: BC Managed Care – PPO | Admitting: Gastroenterology

## 2019-05-19 ENCOUNTER — Encounter: Payer: Self-pay | Admitting: Gastroenterology

## 2019-05-19 ENCOUNTER — Other Ambulatory Visit: Payer: Self-pay

## 2019-05-19 VITALS — BP 132/72 | HR 77 | Temp 98.8°F | Resp 19 | Ht 64.0 in | Wt 170.0 lb

## 2019-05-19 DIAGNOSIS — D122 Benign neoplasm of ascending colon: Secondary | ICD-10-CM | POA: Diagnosis not present

## 2019-05-19 DIAGNOSIS — Z1211 Encounter for screening for malignant neoplasm of colon: Secondary | ICD-10-CM | POA: Diagnosis present

## 2019-05-19 DIAGNOSIS — D123 Benign neoplasm of transverse colon: Secondary | ICD-10-CM

## 2019-05-19 MED ORDER — SODIUM CHLORIDE 0.9 % IV SOLN: 500.0000 mL | Freq: Once | INTRAVENOUS | Status: DC

## 2019-05-19 NOTE — Progress Notes (Signed)
Report to PACU, RN, vss, BBS= Clear.  

## 2019-05-19 NOTE — Op Note (Signed)
[facility] Patient Name: Laelah Siravo Procedure Date: 05/19/2019 9:24 AM MRN: [id number] Endoscopist: Milus Banister , MD Age: 53 Referring MD:  Date of Birth: August 25, 1966 Gender: Female Account #: [id number] Procedure:                Colonoscopy Indications:              Screening for colorectal malignant neoplasm Medicines:                Monitored Anesthesia Care Procedure:                Pre-Anesthesia Assessment:                           - Prior to the procedure, a History and Physical                            was performed, and patient medications and                            allergies were reviewed. The patient's tolerance of                            previous anesthesia was also reviewed. The risks                            and benefits of the procedure and the sedation                            options and risks were discussed with the patient.                            All questions were answered, and informed consent                            was obtained. Prior Anticoagulants: The patient has                            taken no previous anticoagulant or antiplatelet                            agents. ASA Grade Assessment: II - A patient with                            mild systemic disease. After reviewing the risks                            and benefits, the patient was deemed in                            satisfactory condition to undergo the procedure.                           After obtaining informed consent, the colonoscope  was passed under direct vision. Throughout the                            procedure, the patient's blood pressure, pulse, and                            oxygen saturations were monitored continuously. The                            Colonoscope was introduced through the anus and                            advanced to the the cecum, identified by                            appendiceal orifice and  ileocecal valve. The                            colonoscopy was performed without difficulty. The                            patient tolerated the procedure well. The quality                            of the bowel preparation was excellent. The                            ileocecal valve, appendiceal orifice, and rectum                            were photographed. Scope In: 9:27:15 AM Scope Out: 9:41:34 AM Scope Withdrawal Time: 0 hours 11 minutes 8 seconds  Total Procedure Duration: 0 hours 14 minutes 19 seconds  Findings:                 Two sessile polyps were found in the transverse                            colon and ascending colon. The polyps were 2 to 4                            mm in size. These polyps were removed with a cold                            snare. Resection and retrieval were complete.                           The exam was otherwise without abnormality on                            direct and retroflexion views. Complications:            No immediate complications. Estimated blood loss:  None. Estimated Blood Loss:     Estimated blood loss: none. Impression:               - Two 2 to 4 mm polyps in the transverse colon and                            in the ascending colon, removed with a cold snare.                            Resected and retrieved.                           - The examination was otherwise normal on direct                            and retroflexion views. Recommendation:           - Patient has a contact number available for                            emergencies. The signs and symptoms of potential                            delayed complications were discussed with the                            patient. Return to normal activities tomorrow.                            Written discharge instructions were provided to the                            patient.                           - Resume previous diet.                            - Continue present medications.                           You will receive a letter within 2-3 weeks with the                            pathology results and my final recommendations.                           If the polyp(s) is proven to be 'pre-cancerous' on                            pathology, you will need repeat colonoscopy in 7                            years. If the polyp(s) is NOT 'precancerous' on  pathology then you should repeat colon cancer                            screening in 10 years with colonoscopy without need                            for colon cancer screening by any method prior to                            then (including stool testing). Milus Banister, MD 05/19/2019 9:44:09 AM This report has been signed electronically.

## 2019-05-19 NOTE — Patient Instructions (Signed)
YOU HAD AN ENDOSCOPIC PROCEDURE TODAY AT THE [facility]:   Refer to the procedure report that was given to you for any specific questions about what was found during the examination.  If the procedure report does not answer your questions, please call your gastroenterologist to clarify.  If you requested that your care partner not be given the details of your procedure findings, then the procedure report has been included in a sealed envelope for you to review at your convenience later.  YOU SHOULD EXPECT: Some feelings of bloating in the abdomen. Passage of more gas than usual.  Walking can help get rid of the air that was put into your GI tract during the procedure and reduce the bloating. If you had a lower endoscopy (such as a colonoscopy or flexible sigmoidoscopy) you may notice spotting of blood in your stool or on the toilet paper. If you underwent a bowel prep for your procedure, you may not have a normal bowel movement for a few days.  Please Note:  You might notice some irritation and congestion in your nose or some drainage.  This is from the oxygen used during your procedure.  There is no need for concern and it should clear up in a day or so.  SYMPTOMS TO REPORT IMMEDIATELY:   Following lower endoscopy (colonoscopy or flexible sigmoidoscopy):  Excessive amounts of blood in the stool  Significant tenderness or worsening of abdominal pains  Swelling of the abdomen that is new, acute  Fever of 100F or higher  For urgent or emergent issues, a gastroenterologist can be reached at any hour by calling [phone].   DIET:  We do recommend a small meal at first, but then you may proceed to your regular diet.  Drink plenty of fluids but you should avoid alcoholic beverages for 24 hours.  ACTIVITY:  You should plan to take it easy for the rest of today and you should NOT DRIVE or use heavy machinery until tomorrow (because of the sedation medicines used during the test).     FOLLOW UP: Our staff will call the number listed on your records 48-72 hours following your procedure to check on you and address any questions or concerns that you may have regarding the information given to you following your procedure. If we do not reach you, we will leave a message.  We will attempt to reach you two times.  During this call, we will ask if you have developed any symptoms of COVID 19. If you develop any symptoms (ie: fever, flu-like symptoms, shortness of breath, cough etc.) before then, please call [phone].  If you test positive for Covid 19 in the 2 weeks post procedure, please call and report this information to us.    If any biopsies were taken you will be contacted by phone or by letter within the next 1-3 weeks.  Please call us at [phone] if you have not heard about the biopsies in 3 weeks.    SIGNATURES/CONFIDENTIALITY: You and/or your care partner have signed paperwork which will be entered into your electronic medical record.  These signatures attest to the fact that that the information above on your After Visit Summary has been reviewed and is understood.  Full responsibility of the confidentiality of this discharge information lies with you and/or your care-partner. 

## 2019-05-19 NOTE — Progress Notes (Signed)
Called to room to assist during endoscopic procedure.  Patient ID and intended procedure confirmed with present staff. Received instructions for my participation in the procedure from the performing physician.  

## 2019-05-19 NOTE — Progress Notes (Signed)
Pt's states no medical or surgical changes since previsit or office visit.  Rica Mote CMA and Riki Sheer LPN.

## 2019-05-23 ENCOUNTER — Telehealth: Payer: Self-pay | Admitting: *Deleted

## 2019-05-23 NOTE — Telephone Encounter (Signed)
  Follow up Call-  Call back number 05/19/2019  Post procedure Call Back phone  # [phone]  Permission to leave phone message Yes  Some recent data might be hidden     Patient questions:  Do you have a fever, pain , or abdominal swelling? No. Pain Score  0 *  Have you tolerated food without any problems? Yes.    Have you been able to return to your normal activities? Yes.    Do you have any questions about your discharge instructions: Diet   No. Medications  No. Follow up visit  No.  Do you have questions or concerns about your Care? No.  Actions: * If pain score is 4 or above: No action needed, pain <4. Covid-19 screening questions    Do you now or have you had a fever in the last 14 days? no Do you have any respiratory symptoms of shortness of breath or cough now or in the last 14 days? no  Do you have any family members or close contacts with diagnosed or suspected Covid-19 in the past 14 days? no  Have you been tested for Covid-19 and found to be positive? no

## 2019-05-23 NOTE — Telephone Encounter (Signed)
First follow up call attempt.  Reached voicemail with phone number identified.  Message to call if any questions or concerns regarding procedure or  having exposure or symptoms of COVID-19.

## 2019-05-24 ENCOUNTER — Encounter: Payer: Self-pay | Admitting: Gastroenterology

## 2019-07-17 NOTE — Telephone Encounter (Signed)
Chart entered in error. SM 

## 2020-03-19 LAB — HM MAMMOGRAPHY

## 2023-09-10 ENCOUNTER — Other Ambulatory Visit: Payer: Self-pay

## 2023-11-02 ENCOUNTER — Ambulatory Visit: Payer: BC Managed Care – PPO | Admitting: Family Medicine

## 2023-11-02 ENCOUNTER — Encounter: Payer: Self-pay | Admitting: Family Medicine

## 2023-11-02 VITALS — BP 132/80 | HR 66 | Temp 98.2°F | Ht 63.0 in | Wt 138.4 lb

## 2023-11-02 DIAGNOSIS — E782 Mixed hyperlipidemia: Secondary | ICD-10-CM | POA: Diagnosis not present

## 2023-11-02 DIAGNOSIS — I10 Essential (primary) hypertension: Secondary | ICD-10-CM

## 2023-11-02 DIAGNOSIS — G4700 Insomnia, unspecified: Secondary | ICD-10-CM

## 2023-11-02 MED ORDER — TRAZODONE HCL 50 MG PO TABS: 100.0000 mg | ORAL_TABLET | Freq: Every day | ORAL | 2 refills | Status: DC

## 2023-11-02 MED ORDER — LORAZEPAM 1 MG PO TABS: 2.0000 mg | ORAL_TABLET | Freq: Every day | ORAL | 0 refills | Status: DC

## 2023-11-02 NOTE — Assessment & Plan Note (Signed)
Chronic, stable. On olmesartan 20 mg daily. BP today is well controlled, will continue as prescribed

## 2023-11-02 NOTE — Progress Notes (Signed)
New Patient Office Visit  Subjective    Patient ID: [name], female    DOB: 06-09-1966  Age: 57 y.o. MRN: [id number]  CC:  Chief Complaint  Patient presents with   Establish Care    HPI [name] presents to establish care Patient is switching insurances and needed a new PCP. She denies any new symptoms or new problems. Was living in [place] for about 3 years, States that she recently moved back to town.   Pt was diagnosed with diabetes about 2 1/2 years ago, had A1C of 8.3. she started controlling her diet and exercising and she lost about 70 pounds. A1C was 5.6 in May 2024, no longer needing medication to control her blood sugars.   Also has HTN which is well controlled and HLD which also well controlled.   Patient has a history of severe insomnia. States that she does have some underlying anxiety, takes 1-2 during the day, but at night she has to take 2-3 lorazepam plus trazodone at night. States that she mainly uses it at night, has been on this medication for many years. Last prescription was filled in [facility] 10/7 for 120 tablets. She reports that they did try her on other medications to help her sleep at night. Has taken [place] in the past, states that the [place] increases her appetite. States that she sometimes gets up at night to urinate and the trazodone sometimes makes her feel groggy. States she used to have a lot of stress with her job, but is retired now and does not have as much stress now. I asked about how many tablets she had left at home and she initially reported she was out of the medication, then she said she has about 10 tablets left. She reports she has had trouble sleeping since she was 57 years old, states that she has seen specialists for this in the past and they never could figure out why she had so much trouble.    We had a long discussion about the long term use of benzodiazepines and the risk of dependency and dementia, accidental overdose, and  falls. that the use of other safer medications would be preferred over the use of benzodiazepines. I did offer her a referral to psychiatry if she felt she needed to take them during the day and she reports she is "only taking them at night for the most part."  I have reviewed previous notes from Dr. Cyndia Bent and Dr. Posey Rea documenting their chronic use.   We discussed that the intention would be to wean the lorazepam off over time while increasing trazodone and she is agreeable.   Pt already has appt with Dr. Eustaquio Boyden for mammo and pap. 01/19/24  I have reviewed all aspects of the patient's medical history including social, family, and surgical history.   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    Past Surgical History:  Procedure Laterality Date   NEPHRECTOMY  1991   SINUS SURGERY WITH INSTATRAK  1991   WISDOM TOOTH EXTRACTION      Family History  Problem Relation Age of Onset   Alzheimer's disease Father    Colon cancer Maternal Grandmother        early 50's   Stroke Maternal Grandmother    Esophageal cancer Neg Hx    Stomach cancer Neg Hx    Rectal cancer Neg Hx    Colon polyps Neg Hx     Social  History   Socioeconomic History   Marital status: Married    Spouse name: Not on file   Number of children: Not on file   Years of education: Not on file   Highest education level: Not on file  Occupational History   Not on file  Tobacco Use   Smoking status: Never   Smokeless tobacco: Never  Vaping Use   Vaping status: Never Used  Substance and Sexual Activity   Alcohol use: No   Drug use: No   Sexual activity: Yes    Birth control/protection: None  Other Topics Concern   Not on file  Social History Narrative   Not on file   Social Determinants of Health   Financial Resource Strain: Low Risk  (04/25/2023)   Received from [facility]   Overall Financial Resource Strain (CARDIA)    Difficulty of Paying Living Expenses: Not hard at all  Food Insecurity: No Food Insecurity (04/25/2023)   Received from [facility]   Hunger Vital Sign    Worried About Running Out of Food in the Last Year: Never true    Ran Out of Food in the Last Year: Never true  Transportation Needs: No Transportation Needs (04/25/2023)   Received from [facility] - Transportation    Lack of Transportation (Medical): No    Lack of Transportation (Non-Medical): No  Physical Activity: Sufficiently Active (04/25/2023)   Received from [facility]   Exercise Vital Sign    Days of Exercise per Week: 5 days    Minutes of Exercise per Session: 60 min  Stress: No Stress Concern Present (04/25/2023)   Received from [facility] - Occupational Stress Questionnaire    Feeling of Stress : Not at all  Social Connections: Unknown (03/20/2023)   Received from [facility]   Social Network    Social Network: Not on file  Intimate Partner Violence: Not At Risk (04/25/2023)   Received from [facility]   HITS    Over the last 12 months how often did your partner physically hurt you?: Never    Over the last 12 months how often did your partner insult you or  talk down to you?: Never    Over the last 12 months how often did your partner threaten you with physical harm?: Never    Over the last 12 months how often did your partner scream or curse at you?: Never    Review of Systems  All other systems reviewed and are negative.       Objective    BP 132/80 (BP Location:  Left Arm, Patient Position: Sitting, Cuff Size: Normal)   Pulse 66   Temp 98.2 F (36.8 C) (Oral)   Ht 5\' 3"  (1.6 m)   Wt 138 lb 6.4 oz (62.8 kg)   SpO2 99%   BMI 24.52 kg/m   Physical Exam Vitals reviewed.  Constitutional:      Appearance: Normal appearance. She is well-groomed and normal weight.  Cardiovascular:     Rate and Rhythm: Normal rate and regular rhythm.     Heart sounds: S1 normal and S2 normal.  Pulmonary:     Effort: Pulmonary effort is normal.     Breath sounds: Normal breath sounds and air entry.  Musculoskeletal:     Right lower leg: No edema.     Left lower leg: No edema.  Neurological:     Mental Status: She is alert and oriented to person, place, and time. Mental status is at baseline.     Gait: Gait is intact.  Psychiatric:        Mood and Affect: Mood and affect normal.        Speech: Speech normal.        Behavior: Behavior normal.        Judgment: Judgment normal.         Assessment & Plan:  HYPERTENSION Assessment & Plan: Chronic, stable. On olmesartan 20 mg daily. BP today is well controlled, will continue as prescribed   INSOMNIA, PERSISTENT Assessment & Plan: Patient has been on lorazepam for many years, 10 + years according to the records. She initially stated she was taking it during the day as well for anxiety, however later she reported she was only taking it at night. There were a few discrepancies in her story as she was giving the history. We discussed the risks associated with long term benzodiazepine use including the risk of withdrawal, dependency, dementia, falls, etc. I advised that it would be preferable to  increase her trazodone and reduce the use of lorazepam. I will refill the first month at 2 tablets at bedtime, then next month will reduce the dose to 1 tablet a t bedtime with the intention of weaning her off the medication over time. I did offer her a referral to psychiatry to continue the medication however the patient declined. I will see her in the office in 6 months for her annual physical.   Orders: -     traZODone HCl; Take 2 tablets (100 mg total) by mouth at bedtime.  Dispense: 180 tablet; Refill: 2 -     LORazepam; Take 2 tablets (2 mg total) by mouth at bedtime.  Dispense: 60 tablet; Refill: 0  Mixed hyperlipidemia Assessment & Plan: Chronic, stable, on crestor 20 mg daily. Will continue this as prescribed. I reviewed her last set of blood work from May 2024 and her numbers are well controlled.      Return in about 6 months (around 05/01/2024) for annual physical exam.   Karie Georges, MD

## 2023-11-02 NOTE — Assessment & Plan Note (Signed)
Patient has been on lorazepam for many years, 10 + years according to the records. She initially stated she was taking it during the day as well for anxiety, however later she reported she was only taking it at night. There were a few discrepancies in her story as she was giving the history. We discussed the risks associated with long term benzodiazepine use including the risk of withdrawal, dependency, dementia, falls, etc. I advised that it would be preferable to increase her trazodone and reduce the use of lorazepam. I will refill the first month at 2 tablets at bedtime, then next month will reduce the dose to 1 tablet a t bedtime with the intention of weaning her off the medication over time. I did offer her a referral to psychiatry to continue the medication however the patient declined. I will see her in the office in 6 months for her annual physical.

## 2023-11-02 NOTE — Assessment & Plan Note (Signed)
Chronic, stable, on crestor 20 mg daily. Will continue this as prescribed. I reviewed her last set of blood work from May 2024 and her numbers are well controlled.

## 2023-11-14 ENCOUNTER — Other Ambulatory Visit: Payer: Self-pay | Admitting: Family Medicine

## 2023-11-14 ENCOUNTER — Encounter: Payer: Self-pay | Admitting: Family Medicine

## 2023-11-16 MED ORDER — OLMESARTAN MEDOXOMIL 20 MG PO TABS: 20.0000 mg | ORAL_TABLET | Freq: Every day | ORAL | 1 refills | Status: DC

## 2024-01-06 ENCOUNTER — Encounter: Payer: Self-pay | Admitting: Family Medicine

## 2024-01-06 ENCOUNTER — Other Ambulatory Visit: Payer: Self-pay | Admitting: Family Medicine

## 2024-01-06 DIAGNOSIS — G4700 Insomnia, unspecified: Secondary | ICD-10-CM

## 2024-01-06 MED ORDER — LORAZEPAM 1 MG PO TABS: 1.0000 mg | ORAL_TABLET | Freq: Every day | ORAL | 2 refills | Status: DC

## 2024-02-08 LAB — HM PAP SMEAR: HM Pap smear: NORMAL

## 2024-04-04 ENCOUNTER — Other Ambulatory Visit: Payer: Self-pay | Admitting: Family Medicine

## 2024-04-04 ENCOUNTER — Encounter: Payer: Self-pay | Admitting: *Deleted

## 2024-04-04 DIAGNOSIS — G4700 Insomnia, unspecified: Secondary | ICD-10-CM

## 2024-04-04 NOTE — Telephone Encounter (Signed)
 Actually it looks like she still has a refill at the pharmacy-- please let her know

## 2024-04-04 NOTE — Telephone Encounter (Signed)
 Please call pt and let her know that she needs an appt in order to get future refills of the lorazepam -- the DEA requires this for all controlled medications. I will be reducing her to 1/2 tablet at bedtime as we had previously dicussed at her visit in November.

## 2024-04-06 ENCOUNTER — Other Ambulatory Visit: Payer: Self-pay | Admitting: Family Medicine

## 2024-04-06 DIAGNOSIS — G4700 Insomnia, unspecified: Secondary | ICD-10-CM

## 2024-04-06 NOTE — Telephone Encounter (Signed)
 Copied from CRM [id number]. Topic: Clinical - Medication Refill >> Apr 06, 2024  2:20 PM Earnestine Goes B wrote: Most Recent Primary Care Visit:  Provider: Aida House  Department: [facility]  Visit Type: NEW PATIENT  Date: 11/02/2023  Medication: LORazepam  (ATIVAN ) 1 MG tablet  Has the patient contacted their pharmacy? Yes (Agent: If no, request that the patient contact the pharmacy for the refill. If patient does not wish to contact the pharmacy document the reason why and proceed with request.) (Agent: If yes, when and what did the pharmacy advise?)  Is this the correct pharmacy for this prescription? Yes If no, delete pharmacy and type the correct one.  This is the patient's preferred pharmacy:  [id number] - [place], [place] - [address] [place] [address] Phone: [phone] Fax: [phone]   Has the prescription been filled recently? Yes  Is the patient out of the medication? Yes  Has the patient been seen for an appointment in the last year OR does the patient have an upcoming appointment? Yes  Can we respond through MyChart? Yes  Agent: Please be advised that Rx refills may take up to 3 business days. We ask that you follow-up with your pharmacy.

## 2024-04-07 ENCOUNTER — Encounter: Payer: Self-pay | Admitting: *Deleted

## 2024-04-07 NOTE — Telephone Encounter (Signed)
 Pt has not made an appt-- I'm not sure she has checked my chart-- please call pt and have her schedule

## 2024-05-06 ENCOUNTER — Other Ambulatory Visit: Payer: Self-pay | Admitting: Family Medicine

## 2024-05-06 DIAGNOSIS — G4700 Insomnia, unspecified: Secondary | ICD-10-CM

## 2024-05-08 NOTE — Telephone Encounter (Signed)
 Patient informed of the message below and an appt was scheduled for 6/16.  Message sent to PCP.

## 2024-05-08 NOTE — Telephone Encounter (Signed)
 Patient needs an appt-- the original intent was to wean off this medication, please have her schedule, then I will fill the medication until her appointment, she hasn't checked my chart since January so she will likely need to be called to schedule her appointment.

## 2024-05-22 ENCOUNTER — Encounter: Payer: Self-pay | Admitting: Family Medicine

## 2024-05-22 ENCOUNTER — Ambulatory Visit (INDEPENDENT_AMBULATORY_CARE_PROVIDER_SITE_OTHER): Payer: Self-pay | Admitting: Family Medicine

## 2024-05-22 VITALS — BP 130/80 | HR 67 | Temp 98.3°F | Ht 63.0 in | Wt 150.2 lb

## 2024-05-22 DIAGNOSIS — E782 Mixed hyperlipidemia: Secondary | ICD-10-CM

## 2024-05-22 DIAGNOSIS — I10 Essential (primary) hypertension: Secondary | ICD-10-CM | POA: Diagnosis not present

## 2024-05-22 DIAGNOSIS — G4700 Insomnia, unspecified: Secondary | ICD-10-CM

## 2024-05-22 DIAGNOSIS — Z0000 Encounter for general adult medical examination without abnormal findings: Secondary | ICD-10-CM | POA: Diagnosis not present

## 2024-05-22 DIAGNOSIS — R739 Hyperglycemia, unspecified: Secondary | ICD-10-CM | POA: Diagnosis not present

## 2024-05-22 MED ORDER — TRAZODONE HCL 50 MG PO TABS: 100.0000 mg | ORAL_TABLET | Freq: Every day | ORAL | 2 refills | Status: AC

## 2024-05-22 MED ORDER — LORAZEPAM 0.5 MG PO TABS: 0.5000 mg | ORAL_TABLET | Freq: Every day | ORAL | 0 refills | Status: DC

## 2024-05-22 MED ORDER — OLMESARTAN MEDOXOMIL 20 MG PO TABS: 20.0000 mg | ORAL_TABLET | Freq: Every day | ORAL | 1 refills | Status: AC

## 2024-05-22 MED ORDER — RAMELTEON 8 MG PO TABS: 8.0000 mg | ORAL_TABLET | Freq: Every day | ORAL | 0 refills | Status: DC

## 2024-05-22 NOTE — Assessment & Plan Note (Signed)
 Chronic, stable, on crestor 20 mg every other day per her report. Will continue this as prescribed.needs new lipid panel today

## 2024-05-22 NOTE — Assessment & Plan Note (Signed)
 Patient has been on lorazepam  for many years, 10 + years according to the records. She initially stated she was taking it during the day as well for anxiety, however later she reported she was only taking it at night. There were a few discrepancies in her story as she was giving the history. We discussed the risks associated with long term benzodiazepine use including the risk of withdrawal, dependency, dementia, falls, etc.   Pt is doing better on the 2 tablets of trazodone , however she is still requiring 1 mg of lorazepam  at night. We discussed options and I recommended a trial of rozerem 8 mg at bedtime with 0.5 mg lorazepam  and the 2 tablets of trazodone . She will message me through my chart to let me know how the medication is working. Then next month we should be able to reduce to 0.25 mg at night of lorazepam .

## 2024-05-22 NOTE — Progress Notes (Signed)
 Complete physical exam  Patient: [name]   DOB: 05/16/1966   58 y.o. Female  MRN: [id number]  Subjective:    Chief Complaint  Patient presents with   Medication Refill    Patient states she was told a follow up visit is needed for refills on Alprazolam    [name] is a 58 y.o. female who presents today for a complete physical exam. She reports consuming a general diet. Home exercise routine includes treadmill and 30 minutes daily. She generally feels well. She reports sleeping fairly well, still taking the trazodone  and lorazepam  at night. She does have additional problems to discuss today.   Pt states she still continues to take the 1 mg lorazepam  at night. States that the 2 tablets of the trazodone  does help make her sleepy however when she goes to bed she just lays awake and she cannot initiate her sleep. States she does not think that there is anything that is keeping her awake.    Had her pap with Dr. Renay Carota in February-- states that she has her mammogram and also a DEXA scan scheduled as well. Will get records.  HTN -- BP in office performed and is well controlled. She  reports no side effects to the medications, no chest pain, SOB, dizziness or headaches. She has a BP cuff at home and is checking BP regularly, reports they are in the normal range.      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   estradiol (VIVELLE-DOT) 0.05 MG/24HR patch Place 1 patch onto the skin 2 (two) times a week.   FIBER SELECT GUMMIES PO Take 1 tablet by mouth daily.     ibuprofen (ADVIL,MOTRIN) 200 MG tablet Take 800 mg by mouth every 8 (eight) hours as needed. For pain    Multiple Vitamin (MULTIVITAMIN) tablet Take 1 tablet by mouth daily.     ondansetron  (ZOFRAN -ODT) 8 MG disintegrating tablet Take 1 tablet (8 mg total) by mouth as needed. 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   [DISCONTINUED] LORazepam  (ATIVAN ) 1 MG tablet TAKE 1 TABLET BY MOUTH AT BEDTIME.   [DISCONTINUED] olmesartan  (BENICAR ) 20 MG tablet Take 1 tablet (20 mg total) by mouth daily.   [DISCONTINUED] traZODone  (DESYREL ) 50 MG tablet Take 2 tablets (100 mg total) by  mouth at bedtime.   No facility-administered medications prior to visit.    Review of Systems  HENT:  Negative for hearing loss.   Eyes:  Negative for blurred vision.  Respiratory:  Negative for shortness of breath.   Cardiovascular:  Negative for chest pain.  Gastrointestinal: Negative.   Genitourinary: Negative.   Musculoskeletal:  Negative for back pain.  Neurological:  Negative for headaches.  Psychiatric/Behavioral:  Negative for depression.        Objective:     BP 130/80   Pulse 67   Temp 98.3 F (36.8 C) (Oral)   Ht 5' 3 (1.6 m)   Wt 150 lb 3.2 oz (68.1 kg)   LMP 05/23/2019 (Exact Date)   SpO2 99%   BMI 26.61 kg/m    Physical Exam Vitals reviewed.  Constitutional:       Appearance: Normal appearance. She is well-groomed and normal weight.  HENT:     Right Ear: Tympanic membrane and ear canal normal.     Left Ear: Tympanic membrane and ear canal normal.     Mouth/Throat:     Mouth: Mucous membranes are moist.     Pharynx: No posterior oropharyngeal erythema.   Eyes:     Conjunctiva/sclera: Conjunctivae normal.   Neck:     Thyroid: No thyromegaly.   Cardiovascular:     Rate and Rhythm: Normal rate and regular rhythm.     Pulses: Normal pulses.     Heart sounds: S1 normal and S2 normal.  Pulmonary:     Effort: Pulmonary effort is normal.     Breath sounds: Normal breath sounds and air entry.  Abdominal:     General: Abdomen is flat. Bowel sounds are normal.     Palpations: Abdomen is soft.   Musculoskeletal:     Right lower leg: No edema.     Left lower leg: No edema.  Lymphadenopathy:     Cervical: No cervical adenopathy.   Neurological:     Mental Status: She is alert and oriented to person, place, and time. Mental status is at baseline.     Gait: Gait is intact.   Psychiatric:        Mood and Affect: Mood and affect normal.        Speech: Speech normal.        Behavior: Behavior normal.        Judgment: Judgment normal.      No results found for any visits on 05/22/24.     Assessment & Plan:    Routine Health Maintenance and Physical Exam  Immunization History  Administered Date(s) Administered   Influenza Whole 09/03/2009, 09/25/2010, 09/06/2012   Influenza-Unspecified 09/02/2023   PFIZER Comirnaty(Gray Top)Covid-19 Tri-Sucrose Vaccine 04/10/2021   PFIZER(Purple Top)SARS-COV-2 Vaccination 09/13/2020   Pfizer Covid-19 Vaccine Bivalent Booster 83yrs & up 09/10/2021   Td 07/18/2009   Tdap 11/01/2020   Zoster Recombinant(Shingrix) 07/07/2021    Health Maintenance  Topic Date Due   MAMMOGRAM  05/21/2018   Cervical Cancer Screening (HPV/Pap Cotest)  08/07/2018   COVID-19 Vaccine (4 - 2024-25 season) 08/08/2023    Hepatitis C Screening  11/01/2024 (Originally 05/21/1986)   HIV Screening  11/01/2024 (Originally 05/22/1983)   INFLUENZA VACCINE  07/07/2024   Colonoscopy  05/18/2029   DTaP/Tdap/Td (3 - Td or Tdap) 11/01/2030   Zoster Vaccines- Shingrix  Completed   HPV VACCINES  Aged Out   Meningococcal B Vaccine  Aged Out    Discussed health benefits of physical activity, and  encouraged her to engage in regular exercise appropriate for her age and condition.  Well adult exam  HYPERTENSION Assessment & Plan: Chronic, stable. On olmesartan  20 mg daily. BP today is well controlled, will continue as prescribed, refills sent to pharmacy  Orders: -     Comprehensive metabolic panel with GFR; Future -     Olmesartan  Medoxomil; Take 1 tablet (20 mg total) by mouth daily.  Dispense: 90 tablet; Refill: 1  Mixed hyperlipidemia Assessment & Plan: Chronic, stable, on crestor 20 mg every other day per her report. Will continue this as prescribed.needs new lipid panel today  Orders: -     Lipid panel; Future  Hyperglycemia -     Hemoglobin A1c; Future  INSOMNIA, PERSISTENT Assessment & Plan: Patient has been on lorazepam  for many years, 10 + years according to the records. She initially stated she was taking it during the day as well for anxiety, however later she reported she was only taking it at night. There were a few discrepancies in her story as she was giving the history. We discussed the risks associated with long term benzodiazepine use including the risk of withdrawal, dependency, dementia, falls, etc.   Pt is doing better on the 2 tablets of trazodone , however she is still requiring 1 mg of lorazepam  at night. We discussed options and I recommended a trial of rozerem 8 mg at bedtime with 0.5 mg lorazepam  and the 2 tablets of trazodone . She will message me through my chart to let me know how the medication is working. Then next month we should be able to reduce to 0.25 mg at night of  lorazepam .  Orders: -     traZODone  HCl; Take 2 tablets (100 mg total) by mouth at bedtime.  Dispense: 180 tablet; Refill: 2 -     Ramelteon; Take 1 tablet (8 mg total) by mouth at bedtime.  Dispense: 30 tablet; Refill: 0 -     LORazepam ; Take 1 tablet (0.5 mg total) by mouth at bedtime.  Dispense: 30 tablet; Refill: 0  Normal physical exam findings. I counseled the patient on the recommended amount of exercise per CDC recommendation. I reviewed preventative screening, immunizations, and medical history and updated in the chart, and appropriate labs and vaccinations were ordered. Handouts given on healthy eating and exercise.    Return in about 6 months (around 11/21/2024) for HTN, medication refills.     Aida House, MD

## 2024-05-22 NOTE — Assessment & Plan Note (Signed)
 Chronic, stable. On olmesartan  20 mg daily. BP today is well controlled, will continue as prescribed, refills sent to pharmacy

## 2024-05-23 LAB — LIPID PANEL
Chol/HDL Ratio: 2.6 ratio (ref 0.0–4.4)
Cholesterol, Total: 174 mg/dL (ref 100–199)
HDL: 67 mg/dL (ref >39)
LDL Chol Calc (NIH): 87 mg/dL (ref 0–99)
Triglycerides: 115 mg/dL (ref 0–149)
VLDL Cholesterol Cal: 20 mg/dL (ref 5–40)

## 2024-05-23 LAB — HEMOGLOBIN A1C
Est. average glucose Bld gHb Est-mCnc: 111 mg/dL
Hgb A1c MFr Bld: 5.5 % (ref 4.8–5.6)

## 2024-05-23 LAB — COMPREHENSIVE METABOLIC PANEL WITH GFR
ALT: 18 IU/L (ref 0–32)
AST: 17 IU/L (ref 0–40)
Albumin: 4.9 g/dL (ref 3.8–4.9)
Alkaline Phosphatase: 70 IU/L (ref 44–121)
BUN/Creatinine Ratio: 25 — ABNORMAL HIGH (ref 9–23)
BUN: 20 mg/dL (ref 6–24)
Bilirubin Total: <0.2 mg/dL (ref 0.0–1.2)
CO2: 21 mmol/L (ref 20–29)
Calcium: 10.4 mg/dL — ABNORMAL HIGH (ref 8.7–10.2)
Chloride: 104 mmol/L (ref 96–106)
Creatinine, Ser: 0.8 mg/dL (ref 0.57–1.00)
Globulin, Total: 2.4 g/dL (ref 1.5–4.5)
Glucose: 100 mg/dL — ABNORMAL HIGH (ref 70–99)
Potassium: 4.5 mmol/L (ref 3.5–5.2)
Sodium: 141 mmol/L (ref 134–144)
Total Protein: 7.3 g/dL (ref 6.0–8.5)
eGFR: 86 mL/min/{1.73_m2} (ref >59)

## 2024-05-24 ENCOUNTER — Ambulatory Visit: Payer: Self-pay | Admitting: Family Medicine

## 2024-05-26 ENCOUNTER — Encounter: Payer: Self-pay | Admitting: Family Medicine

## 2024-06-13 ENCOUNTER — Other Ambulatory Visit: Payer: Self-pay | Admitting: Family Medicine

## 2024-06-13 DIAGNOSIS — G4700 Insomnia, unspecified: Secondary | ICD-10-CM

## 2024-06-19 ENCOUNTER — Other Ambulatory Visit (HOSPITAL_COMMUNITY): Payer: Self-pay

## 2024-06-20 ENCOUNTER — Encounter: Payer: Self-pay | Admitting: Family Medicine

## 2024-06-21 ENCOUNTER — Telehealth: Payer: Self-pay

## 2024-06-21 ENCOUNTER — Other Ambulatory Visit (HOSPITAL_COMMUNITY): Payer: Self-pay

## 2024-06-21 NOTE — Telephone Encounter (Signed)
 See prior Mychart message-sent to prior auth team.

## 2024-06-21 NOTE — Telephone Encounter (Signed)
 Pharmacy Patient Advocate Encounter   Received notification from Patient Advice Request messages that prior authorization for RAMELTEON  8MG  is required/requested.   Insurance verification completed.   The patient is insured through [facility] .   Per test claim: PA required and submitted KEY/EOC/Request #: [id number] from 06/21/24 to 06/21/25. Ran test claim, Copay is $5. This test claim was processed through [facility]- copay amounts may vary at other pharmacies due to pharmacy/plan contracts, or as the patient moves through the different stages of their insurance plan.

## 2024-06-21 NOTE — Telephone Encounter (Signed)
 See prior Mychart message.

## 2024-11-20 ENCOUNTER — Other Ambulatory Visit: Payer: Self-pay | Admitting: Family Medicine

## 2024-11-20 DIAGNOSIS — G4700 Insomnia, unspecified: Secondary | ICD-10-CM

## 2024-12-09 ENCOUNTER — Other Ambulatory Visit: Payer: Self-pay | Admitting: Family Medicine

## 2024-12-20 ENCOUNTER — Encounter: Payer: Self-pay | Admitting: Family Medicine

## 2024-12-20 DIAGNOSIS — E782 Mixed hyperlipidemia: Secondary | ICD-10-CM

## 2024-12-20 MED ORDER — ROSUVASTATIN CALCIUM 10 MG PO TABS: 10.0000 mg | ORAL_TABLET | Freq: Every day | ORAL | 3 refills | Status: AC
